# Patient Record
Sex: Female | Born: 1985 | Race: Black or African American | Hispanic: No | Marital: Married | Smoking: Never smoker
Health system: Southern US, Community
[De-identification: ages and names within clinical notes are randomized; demographics above are authoritative.]

## PROBLEM LIST (undated history)

## (undated) DIAGNOSIS — J45909 Unspecified asthma, uncomplicated: Secondary | ICD-10-CM

## (undated) HISTORY — PX: NO PAST SURGERIES: SHX2092

---
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
## 2005-03-19 ENCOUNTER — Other Ambulatory Visit: Admission: RE | Admit: 2005-03-19 | Discharge: 2005-03-19 | Payer: Self-pay | Admitting: Obstetrics & Gynecology

## 2005-03-20 ENCOUNTER — Other Ambulatory Visit: Admission: RE | Admit: 2005-03-20 | Discharge: 2005-03-20 | Payer: Self-pay | Admitting: Obstetrics & Gynecology

## 2007-06-06 ENCOUNTER — Other Ambulatory Visit: Admission: RE | Admit: 2007-06-06 | Discharge: 2007-06-06 | Payer: Self-pay | Admitting: Gynecology

## 2008-04-09 ENCOUNTER — Emergency Department (HOSPITAL_COMMUNITY): Admission: EM | Admit: 2008-04-09 | Discharge: 2008-04-09 | Payer: Self-pay | Admitting: Emergency Medicine

## 2010-10-29 DIAGNOSIS — J45909 Unspecified asthma, uncomplicated: Secondary | ICD-10-CM | POA: Insufficient documentation

## 2011-07-26 LAB — POCT I-STAT, CHEM 8
BUN: 7
Calcium, Ion: 1.26
Chloride: 106
Creatinine, Ser: 0.8
Glucose, Bld: 92
HCT: 34 — ABNORMAL LOW
Hemoglobin: 11.6 — ABNORMAL LOW
Potassium: 4.4
Sodium: 140
TCO2: 28

## 2011-07-26 LAB — POCT PREGNANCY, URINE
Operator id: 161631
Preg Test, Ur: NEGATIVE

## 2011-07-26 LAB — URINALYSIS, ROUTINE W REFLEX MICROSCOPIC
Bilirubin Urine: NEGATIVE
Glucose, UA: NEGATIVE
Hgb urine dipstick: NEGATIVE
Ketones, ur: NEGATIVE
Nitrite: NEGATIVE
Protein, ur: NEGATIVE
Specific Gravity, Urine: 1.006
Urobilinogen, UA: 0.2
pH: 7.5

## 2011-07-26 LAB — POCT CARDIAC MARKERS
CKMB, poc: 2.5
Operator id: 161631
Troponin i, poc: <0.05

## 2013-07-28 ENCOUNTER — Emergency Department (HOSPITAL_COMMUNITY)
Admission: EM | Admit: 2013-07-28 | Discharge: 2013-07-28 | Disposition: A | Discharge status: Home / Self Care | Payer: BC Managed Care – PPO | Attending: Emergency Medicine | Admitting: Emergency Medicine

## 2013-07-28 ENCOUNTER — Encounter (HOSPITAL_COMMUNITY): Payer: Self-pay | Admitting: Emergency Medicine

## 2013-07-28 ENCOUNTER — Emergency Department (HOSPITAL_COMMUNITY): Payer: BC Managed Care – PPO

## 2013-07-28 DIAGNOSIS — R0602 Shortness of breath: Secondary | ICD-10-CM

## 2013-07-28 DIAGNOSIS — R42 Dizziness and giddiness: Secondary | ICD-10-CM | POA: Insufficient documentation

## 2013-07-28 DIAGNOSIS — IMO0002 Reserved for concepts with insufficient information to code with codable children: Secondary | ICD-10-CM | POA: Insufficient documentation

## 2013-07-28 DIAGNOSIS — J45909 Unspecified asthma, uncomplicated: Secondary | ICD-10-CM

## 2013-07-28 DIAGNOSIS — Z3202 Encounter for pregnancy test, result negative: Secondary | ICD-10-CM | POA: Insufficient documentation

## 2013-07-28 DIAGNOSIS — R51 Headache: Secondary | ICD-10-CM | POA: Insufficient documentation

## 2013-07-28 DIAGNOSIS — J45901 Unspecified asthma with (acute) exacerbation: Secondary | ICD-10-CM | POA: Insufficient documentation

## 2013-07-28 HISTORY — DX: Unspecified asthma, uncomplicated: J45.909

## 2013-07-28 MED ORDER — IPRATROPIUM BROMIDE 0.02 % IN SOLN
0.5000 mg | Freq: Once | RESPIRATORY_TRACT | Status: AC
  Administered 2013-07-28: 0.5 mg via RESPIRATORY_TRACT

## 2013-07-28 MED ORDER — PREDNISONE 20 MG PO TABS: 40.0000 mg | ORAL_TABLET | Freq: Every day | ORAL | Status: DC

## 2013-07-28 MED ORDER — ALBUTEROL SULFATE (5 MG/ML) 0.5% IN NEBU
INHALATION_SOLUTION | RESPIRATORY_TRACT | Status: AC
  Administered 2013-07-28: 5 mg via RESPIRATORY_TRACT
  Filled 2013-07-28: qty 1

## 2013-07-28 MED ORDER — ALBUTEROL SULFATE (5 MG/ML) 0.5% IN NEBU
5.0000 mg | INHALATION_SOLUTION | Freq: Once | RESPIRATORY_TRACT | Status: AC
  Administered 2013-07-28: 5 mg via RESPIRATORY_TRACT

## 2013-07-28 MED ORDER — IPRATROPIUM BROMIDE 0.02 % IN SOLN
RESPIRATORY_TRACT | Status: AC
  Administered 2013-07-28: 0.5 mg via RESPIRATORY_TRACT
  Filled 2013-07-28: qty 2.5

## 2013-07-28 NOTE — ED Notes (Signed)
RT called for neb tx.

## 2013-07-28 NOTE — ED Notes (Signed)
Pt reports recurrent shortness of breath, gasping for air, coughing, lightheadedness x 4 days

## 2013-07-28 NOTE — Discharge Instructions (Signed)
Asthma, Adult Asthma is a disease of the lungs and can make it hard to breathe. Asthma cannot be cured, but medicine can help control it. Asthma may be started (triggered) by:  Pollen.  Dust.  Animal skin flakes (dander).  Molds.  Foods.  Respiratory infections (colds, flu).  Smoke.  Exercise.  Stress.  Other things that cause allergic reactions or allergies (allergens). HOME CARE   Talk to your doctor about how to manage your attacks at home. This may include:  Using a tool called a peak flow meter.  Having medicine ready to stop the attack.  Take all medicine as told by your doctor.  Wash bed sheets and blankets every week in hot water and put them in the dryer.  Drink enough fluids to keep your pee (urine) clear or pale yellow.  Always be ready to get emergency help. Write down the phone number for your doctor. Keep it where you can easily find it.  Talk about exercise routines with your doctor.  If animal dander is causing your asthma, you may need to find a new home for your pet(s). GET HELP RIGHT AWAY IF:   You have muscle aches.  You cough more.  You have chest pain.  You have thick spit (sputum) that changes to yellow, green, gray, or bloody.  Medicine does not stop your wheezing.  You have problems breathing.  You have a fever.  Your medicine causes:  A rash.  Itching.  Puffiness (swelling).  Breathing problems. MAKE SURE YOU:   Understand these instructions.  Will watch your condition.  Will get help right away if you are not doing well or get worse. Document Released: 04/02/2008 Document Revised: 01/07/2012 Document Reviewed: 08/25/2008 [facility] Patient Information 2014 [place], [place].  Asthma, Acute Bronchospasm Your exam shows you have asthma, or acute bronchospasm that acts like asthma. Bronchospasm means your air passages become narrowed. These conditions are due to inflammation and airway spasm that cause narrowing of the  bronchial tubes in the lungs. This causes you to have wheezing and shortness of breath. POSSIBLE TRIGGERS  Animal dander from the skin, hair, or feathers of animals.  Dust mites contained in house dust.  Cockroaches.  Pollen from trees or grass.  Mold.  Cigarette or tobacco smoke.  Air pollutants such as dust, household cleaners, hair sprays, aerosol sprays, paint fumes, strong chemicals, or strong odors.  Cold air or weather changes. Cold air may cause inflammation. Winds increase molds and pollens in the air.  Strong emotions such as crying or laughing hard.  Stress.  Certain medicines such as aspirin or beta-blockers.  Sulfites in such foods and drinks as dried fruits and wine.  Infections or inflammatory conditions such as a flu, cold, or inflammation of the nasal membranes (rhinitis).  Gastroesophageal reflux disease (GERD). GERD is a condition where stomach acid backs up into your throat (esophagus).  Exercise or strenous activity. TREATMENT  Treatment is aimed at making the narrowed airways larger. Mild asthma or bronchospasm is usually controlled with inhaled medicines. Albuterol is a common medicine that you breathe in to open spastic or narrowed airways. Steroid medicine is also used to reduce the inflammation when an attack is moderate or severe. Antibiotics are only used if a bacterial infection is present.  HOME CARE INSTRUCTIONS   Rest.  Drink plenty of liquids. This helps the mucus to remain thin and easily coughed up. Do not use caffeine or alcohol.  Do not smoke. Avoid being exposed to secondhand smoke.  You play a critical role in keeping yourself in good health. Avoid exposure to things that cause you to wheeze. Avoid exposure to things that cause you to have breathing problems. Keep your medicines up-to-date and available. Carefully follow your caregiver's treatment plan.  When pollen or pollution is bad, keep windows closed and use an air conditioner  or go to places with air conditioning.  Take your medicine exactly as prescribed.  Asthma requires careful medical attention. See your caregiver for follow-up as advised. If you are more than [redacted] weeks pregnant and you were prescribed any new medicines, let your obstetrician know about the visit and how you are doing. Arrange a recheck. SEEK IMMEDIATE MEDICAL CARE IF:   You are getting worse.  You have trouble breathing. If severe, call your local emergency services 911 in [place]  You develop chest pain or discomfort.  You are throwing up or not drinking fluids.  You are coughing up yellow, green, brown, or bloody sputum.  You have a fever or persistent symptoms for more than 2 3 days.  You have a fever and your symptoms suddenly get worse.  You have trouble swallowing. MAKE SURE YOU:   Understand these instructions.  Will watch your condition.  Will get help right away if you are not doing well or get worse. Document Released: 01/30/2007 Document Revised: 10/01/2012 Document Reviewed: 09/29/2007 [facility] Patient Information 2014 [place], [place]. RESOURCE GUIDE  Chronic Pain Problems: Contact [facility]  [phone] Patients need to be referred by their primary care doctor.  Insufficient Money for Medicine: Contact United Way:  call [phone]  No Primary Care Doctor: - Call Health Connect  [phone] - can help you locate a primary care doctor that  accepts your insurance, provides certain services, etc. - Physician Referral Service- [phone]  Agencies that provide inexpensive medical care: - [facility]  [phone] - [facility]  [phone] - [facility]  [phone] - Women's Clinic  [phone] - Planned Parenthood  [phone] [facility]  [phone]  Medicaid-accepting [facility] Providers: - [facility]- [address]  [phone], Mon-Fri 9am-7pm, Sat  9am-1pm - [facility] [place]- [address] [place], [address] [place]  [phone] - [facility]- [address] [place]  [phone] [facility]- [address]  [phone] - Renaye Rakers- [address] [place], [address], [phone]  Only accepts [place] patients after they have their name  applied to their card  Self Pay (no insurance) in [place]: - Sickle Cell Patients - [facility]  [address] [place], [phone] - [facility]- [address] [place]  [phone]       [facility] [place]- [address]       -     [facility]- see information above (Speak to [employer] if you do not have insurance)       -  [facility]- [address],  [phone]       -  [facility]- [address], [phone]       -  Dr Julio Sicks-  [address], [place], [phone]       -  [facility] - [address] [phone]       -  [facility]- [address], [phone], also [address], [phone]       -     [facility]- [address] [place], [phone], 1st & 3rd Saturday         every month, 10am-1pm  -     [facility]   [address] [place], [place].   Phone:  [phone], Fax:  [phone]. Hours of Operation:  9 am - 6 pm, M-F.  -     [facility] for Children   [address], [place]   Phone: [phone], Fax: [phone]. Hours of Operation:  8:30 am - 5:30 pm, M-F.  [facility] [address] [place], [place] [address] [phone]  [facility] [address] [place], [place] [address] [phone]  1) Find a Doctor and Pay Out of Pocket Although you won't have to find out who is covered by your insurance plan, it is a good idea to ask around and get recommendations. You will then  need to call the office and see if the doctor you have chosen will accept you as a new patient and what types of options they offer for patients who are self-pay. Some doctors offer discounts or will set up payment plans for their patients who do not have insurance, but you will need to ask so you aren't surprised when you get to your appointment.  2) Contact Your Local Health Department Not all health departments have doctors that can see patients for sick visits, but many do, so it is worth a call to see if yours does. If you don't know where your local health department is, you can check in your phone book. The CDC also has a tool to help you locate your state's health department, and many state websites also have listings of all of their local health departments.  3) Find a Walk-in Clinic If your illness is not likely to be very severe or complicated, you may want to try a walk in clinic. These are popping up all over the country in pharmacies, drugstores, and shopping centers. They're usually staffed by nurse practitioners or physician assistants that have been trained to treat common illnesses and complaints. They're usually fairly quick and inexpensive. However, if you have serious medical issues or chronic medical problems, these are probably not your best option  STD Testing - [facility] Department of [facility] [place], STD Clinic, [address], [place], phone [phone] or [phone].  Monday - Friday, call for an appointment. [facility] Department of [employer], STD Clinic, [place] [address], [place], phone [phone] or [phone].  Monday - Friday, call for an appointment.  Abuse/Neglect: [facility] Child Abuse Hotline [phone] [facility] Child Abuse Hotline [phone] (After Hours)  Emergency Shelter:  [facility] [phone]  Maternity Homes: - Room at the [place] [facility] [phone] - [facility] ([phone]  MRSA Hotline #:   570-612-8027  Dental Assistance If unable to pay or uninsured, contact:  [facility]. to become qualified for the adult dental clinic.  Patients with Medicaid: [facility] [phone], [phone] [address] [phone]  If unable to pay, or uninsured, contact [facility] [phone] in [place], [phone] in [facility]) to become qualified for the adult dental clinic  [facility] [address] [place], [place] [address] [phone] [email or web address]  Other Proofreader Services: - [facility]- [address] [place], [place], [place], [address], [phone], Ext. [phone], 2nd and 4th Thursday of the month at 6:30am.  10 clients each day by appointment, can sometimes see walk-in patients if someone does not show for an appointment. [facility]- [address] [place], [place], [address], [phone] - [facility] [address] [place], [place], [address], [phone] - [facility]- [phone] [facility]- [phone] [facility] Health [phone]       Behavioral Health Resources in the [facility]  Intensive Outpatient Programs: [facility]      [address] [place], [place] [phone] Both a day and evening program       [facility] Outpatient     [address]        [place], [place] [address] [phone]         ADS: [facility] [address] [place] [phone]  [facility] Mental Health ACCESS LINE: [phone] or [phone] [address] [place], [place] [address] [email or web address]   Substance Abuse Resources: - [facility] Drug [facility]  4421899066 - [facility] [phone] - [facility]  [place] [phone] Floydene Flock [phone] - Residential & Outpatient Substance Abuse Program  [phone]  Psychological Services: [facility]  [phone] [facility]  [phone] - [facility], [phone] [place]. [address] [place], ACCESS LINE: (307)[phone] or 302-064-1934, [email or web address]  Mobile Crisis Teams:                                        Therapeutic Alternatives         Mobile Crisis Care Unit [phone]             [facility] [address] Ginette Otto [phone]                                         Interventionist [address] [place] [phone]  Self-Help/Support Groups: Mental Health Assoc. of [employer] of support groups (734)415-5634 (call for more info)  Narcotics Anonymous (NA) Caring Services [address] [place] - 2 meetings at this location  Residential Treatment Programs:  [facility]      [address]        [place]       [phone]         [facility] [address], [place] [address] [place], [place]  [address] [phone]  [facility]  [address] [place], [place] [address] [phone] Admissions: 8am-3pm M-F  [facility]     [address]        [place], [place] [address]       [phone]         [facility] [address] [place], [place] [phone]  [facility] [address] [place], [place] [phone]  [facility] - Intensive Outpatient      [address]     [place], [place]  [phone]         [facility] (Addiction Recovery Care [facility].)     [address] [place], [place] [phone] or [phone]  Residential Treatment Services (RTS), Medicaid [address] [place], [place] [phone]  Fellowship [address]                                                [address] [place] [phone]  [facility] Outpatient [facility] Resources: [phone]               General Therapy                                                Angie Fava, PhD        [address] [place], [place] [address]         [phone]   Insurance  [facility]al   [address] [place], [place] [address] [phone]  [facility] Recovery [address] [place], [place] [address] [phone] Insurance/Medicaid/sponsorship through [facility] and Families                                              [address]                                        [place], [place] [address]    Therapy/tele-psych/case         [phone]          [facility] [address], [place]  [address]  Adolescent/group home/case management [phone]                                           Creola Corn PhD       General therapy       Insurance   [phone]         Dr. Lolly Mustache, [place], M-F [phone]  [facility] [place]  [employer] [facility] Dept. [address]                 [address]         [address] [place] [address]  [address]  [facility] Phone:  [phone]                                  Phone:  [phone]                   Phone:  [phone]  [facility], [phone] - [facility] - [facility]- [phone]       -     [facility] in [place], [address],             [phone], [facility] Child Abuse Hotline 515-717-8811 or [phone] (After Hours)

## 2013-07-28 NOTE — ED Provider Notes (Addendum)
CSN: [id number]     Arrival date & time 07/28/13  1754 History  This chart was scribed for non-physician practitioner, Junious Silk, PA-C working with Shon Baton, MD by Greggory Stallion, ED scribe. This patient was seen in room WTR8/WTR8 and the patient's care was started at 7:18 PM.   Chief Complaint  Patient presents with  . Shortness of Breath    intermittent coughing and shortness of breath   The history is provided by the patient. No language interpreter was used.    HPI Comments: Misty Logan is a 27 y.o. female who presents to the Emergency Department complaining of intermittent SOB with associated unproductive cough and light headedness that started 4 days ago. She states it feels like it is starting to get worse because the frequency is starting to increase. She states she gets associated headaches and has some rhinorrhea. The rhinorrhea has been going on for quite some time. Pt states she has used her inhaler with no relief. She was diagnosed with asthma about one year ago. Pt states when she breaths in, there is more of a tightness instead of a pain. She denies leg swelling, long trips, recent surgeries, exogenous estrogen, hemoptysis, abdominal pain, nausea, emesis, ear pain, sore throat and congestion. 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 She denies h/o blood clots.  Past Medical History  Diagnosis Date  . Asthma    History reviewed. No pertinent past surgical history. Family History  Problem Relation Age of Onset  . Hypertension Mother   . Hypertension Other   . Diabetes Other    History  Substance Use Topics  . Smoking status: Never Smoker   . Smokeless tobacco: Not on file  . Alcohol Use: Yes   OB History   Grav Para Term Preterm Abortions TAB SAB Ect Mult Living                 Review of Systems  HENT: Positive for rhinorrhea. Negative for ear pain, congestion and sore throat.   Respiratory: Positive for cough and shortness of  breath.   Cardiovascular: Negative for leg swelling.  Gastrointestinal: Negative for nausea, vomiting and abdominal pain.  Neurological: Positive for light-headedness and headaches.  All other systems reviewed and are negative.    Allergies  Review of patient's allergies indicates no known allergies.  Home Medications   Current Outpatient Rx  Name  Route  Sig  Dispense  Refill  . predniSONE (DELTASONE) 20 MG tablet   Oral   Take 2 tablets (40 mg total) by mouth daily. Take 40 mg by mouth daily for 3 days, then 20mg  by mouth daily for 3 days, then 10mg  daily for 3 days   12 tablet   0     BP 108/62  Pulse 87  Temp(Src) 98.7 F (37.1 C) (Oral)  Resp 18  Wt 140 lb (63.504 kg)  SpO2 100%  LMP 06/04/2013  Physical Exam  Nursing note and vitals reviewed. Constitutional: She is oriented to person, place, and time. Vital signs are normal. She appears well-developed and well-nourished.  Non-toxic appearance. She does not have a sickly appearance. She does not appear ill. No distress.  HENT:  Head: Normocephalic and atraumatic.  Right Ear: Tympanic membrane, external ear and ear canal normal.  Left Ear: Tympanic membrane, external ear and ear canal normal.  Nose: Nose normal.  Mouth/Throat: Oropharynx is clear and moist.  Eyes: Conjunctivae are normal.  Neck: Normal range  of motion.  Cardiovascular: Normal rate, regular rhythm, normal heart sounds, intact distal pulses and normal pulses.   Homan's sign negative bilaterally. No swelling of legs bilaterally.   Pulmonary/Chest: Effort normal and breath sounds normal. No stridor. No respiratory distress. She has no decreased breath sounds. She has no wheezes. She has no rhonchi. She has no rales.  Abdominal: Soft. She exhibits no distension.  Musculoskeletal: Normal range of motion.  Lymphadenopathy:    She has no cervical adenopathy.  Neurological: She is alert and oriented to person, place, and time. She has normal strength.   Skin: Skin is warm and dry. She is not diaphoretic. No erythema.  Psychiatric: She has a normal mood and affect. Her behavior is normal.    ED Course  Procedures (including critical care time)  DIAGNOSTIC STUDIES: Oxygen Saturation is 100% on RA, normal by my interpretation.    COORDINATION OF CARE: 7:24 PM-Discussed treatment plan which includes chest xray and breathing treatment with pt at bedside and pt agreed to plan.   Labs Review Labs Reviewed  POCT PREGNANCY, URINE   Imaging Review Dg Chest 2 View  07/28/2013   CLINICAL DATA:  Shortness of breath. History of asthma.  EXAM: CHEST  2 VIEW  COMPARISON:  04/09/2008.  FINDINGS: The cardiac silhouette, mediastinal and hilar contours are normal and stable. The lungs demonstrate mild hyperinflation but no infiltrates, effusions or pneumothorax. The bony thorax is intact.  IMPRESSION: Mild hyperinflation but no acute pulmonary findings.   Electronically Signed   By: Loralie Champagne M.D.   On: 07/28/2013 20:05    MDM   1. Shortness of breath   2. Asthma    Patient presents today with shortness of breath. She is PERC negative. Patient is not a smoker. Patient is otherwise healthy. She has never been hospitalized or intubated for her asthma. Lungs are clear to auscultation. Patient's oxygen saturation is 100% on room air. Chest x-ray shows mild hyperinflation, but no acute pulmonary findings including pneumothorax, effusion, infiltrate. I will discharge her home with a 5 day prednisone burst. Strict return instructions were given. Strongly encouraged to establish care with a primary care provider. Vital signs stable for discharge. Discussed case with Dr. Wilkie Aye who agrees with plan.     I personally performed the services described in this documentation, which was scribed in my presence. The recorded information has been reviewed and is accurate.    Mora Bellman, PA-C 07/28/13 2053  Mora Bellman, PA-C 07/31/13 1525

## 2013-07-29 NOTE — ED Provider Notes (Signed)
Medical screening examination/treatment/procedure(s) were performed by non-physician practitioner and as supervising physician I was immediately available for consultation/collaboration.  Shon Baton, MD 07/29/13 [phone]

## 2013-07-31 NOTE — ED Provider Notes (Signed)
Medical screening examination/treatment/procedure(s) were performed by non-physician practitioner and as supervising physician I was immediately available for consultation/collaboration.   Shon Baton, MD 07/31/13 [phone]

## 2013-09-15 ENCOUNTER — Other Ambulatory Visit: Payer: Self-pay | Admitting: Internal Medicine

## 2013-09-15 DIAGNOSIS — R0602 Shortness of breath: Secondary | ICD-10-CM

## 2013-09-29 ENCOUNTER — Encounter (HOSPITAL_COMMUNITY): Payer: BC Managed Care – PPO

## 2013-11-22 ENCOUNTER — Encounter (HOSPITAL_COMMUNITY): Payer: Self-pay | Admitting: Emergency Medicine

## 2013-11-22 ENCOUNTER — Emergency Department (HOSPITAL_COMMUNITY): Payer: BC Managed Care – PPO

## 2013-11-22 ENCOUNTER — Emergency Department (HOSPITAL_COMMUNITY)
Admission: EM | Admit: 2013-11-22 | Discharge: 2013-11-23 | Disposition: A | Discharge status: Home / Self Care | Payer: BC Managed Care – PPO | Attending: Emergency Medicine | Admitting: Emergency Medicine

## 2013-11-22 DIAGNOSIS — R071 Chest pain on breathing: Secondary | ICD-10-CM | POA: Insufficient documentation

## 2013-11-22 DIAGNOSIS — R0602 Shortness of breath: Secondary | ICD-10-CM

## 2013-11-22 DIAGNOSIS — J45909 Unspecified asthma, uncomplicated: Secondary | ICD-10-CM | POA: Insufficient documentation

## 2013-11-22 DIAGNOSIS — R079 Chest pain, unspecified: Secondary | ICD-10-CM

## 2013-11-22 NOTE — ED Provider Notes (Signed)
CSN: [id number]     Arrival date & time 11/22/13  2001 History   First MD Initiated Contact with Patient 11/22/13 2127     Chief Complaint  Patient presents with  . Asthma   (Consider location/radiation/quality/duration/timing/severity/associated sxs/prior Treatment) HPI Pt is a 28yo female with newly dx asthma c/o chest pain and tightness brought on by asthma exacerbation that started yesterday associated with intermittent dry cough. Pt states she has been trying her nebulizer machine at home w/o relief. States when she uses it she feels "shaky."  Chest pain is squeezing in nature waxes and wanes with cough.  States symptoms have progressively worsened since Friday, 1/23, however states she has been having these intermittent symptoms since Oct. 2014 for which she uses albuterol every 4 hours as needed for her SOB. Denies fever, n/v/d. Denies nasal congestion, sick contacts, or recent travel. Denies cardiac hx. Denies hx of blood clots, leg pain or swelling, denies recent surgeries. Denies taking birth control or other exogenous estrogen.   Past Medical History  Diagnosis Date  . Asthma    History reviewed. No pertinent past surgical history. Family History  Problem Relation Age of Onset  . Hypertension Mother   . Hypertension Other   . Diabetes Other    History  Substance Use Topics  . Smoking status: Never Smoker   . Smokeless tobacco: Not on file  . Alcohol Use: Yes   OB History   Grav Para Term Preterm Abortions TAB SAB Ect Mult Living                 Review of Systems  Respiratory: Positive for cough, chest tightness and shortness of breath.   Cardiovascular: Positive for chest pain.  Gastrointestinal: Negative for nausea, vomiting, abdominal pain and diarrhea.  Psychiatric/Behavioral: The patient is nervous/anxious ( does report "jitteriness" after albuterol nebulizer tx).   All other systems reviewed and are negative.    Allergies  Review of patient's allergies  indicates no known allergies.  Home Medications  No current outpatient prescriptions on file. BP 112/63  Pulse 89  Temp(Src) 98.3 F (36.8 C) (Oral)  Resp 13  Ht 5\' 6"  (1.676 m)  Wt 146 lb (66.225 kg)  BMI 23.58 kg/m2  SpO2 98%  LMP 11/15/2013 Physical Exam  Nursing note and vitals reviewed. Constitutional: She appears well-developed and well-nourished. No distress.  Pt lying comfortably in exam bed, NAD.  Pt texting throughout exam.  HENT:  Head: Normocephalic and atraumatic.  Eyes: Conjunctivae are normal. No scleral icterus.  Neck: Normal range of motion.  Cardiovascular: Normal rate, regular rhythm and normal heart sounds.   Pulmonary/Chest: Effort normal and breath sounds normal. No respiratory distress. She has no wheezes. She has no rales. She exhibits no tenderness.  No respiratory distress, able to speak in full sentences w/o difficulty. Lungs: CTAB  Abdominal: Soft. Bowel sounds are normal. She exhibits no distension and no mass. There is no tenderness. There is no rebound and no guarding.  Musculoskeletal: Normal range of motion.  Neurological: She is alert.  Skin: Skin is warm and dry. She is not diaphoretic.    ED Course  Procedures (including critical care time) Labs Review Labs Reviewed  D-DIMER, QUANTITATIVE   Imaging Review Dg Chest 2 View  11/22/2013   CLINICAL DATA:  Chest pain, shortness of breath  EXAM: CHEST  2 VIEW  COMPARISON:  07/28/2013  FINDINGS: The heart size and mediastinal contours are within normal limits. Both lungs are clear. The visualized  skeletal structures are unremarkable.  IMPRESSION: No active cardiopulmonary disease.   Electronically Signed   By: Ruel Favorsrevor  Shick M.D.   On: 11/22/2013 23:22    EKG Interpretation   None       MDM   1. SOB (shortness of breath)   2. Chest pain    Pt with hx of newly dx asthma presenting with CP and SOB. Not concerned for ACS due to pt's age and lack of risk factors. PERC negative, low suspicion  for PE.  On exam, pt appears well, non-toxic. No respiratory distress. Lungs: CTAB.  Vitals: unremarkable.   CXR: no active cardiopulmonary disease  D-dimer: negative.   All labs/imaging/findings discussed with patient. All questions answered and concerns addressed. Will discharge pt home and have pt f/u with her PCP for referral to pulmonology and/or allergist to help pt determine trigger for her reported SOB. Return precautions given. Pt verbalized understanding and agreement with tx plan. Vitals: unremarkable. Discharged in stable condition.    Discussed pt with attending during ED encounter and agrees with plan.    Junius FinnerErin O'Malley, PA-C 11/23/13 [phone]

## 2013-11-22 NOTE — ED Notes (Signed)
Pt arrived to the ED with a complaint of chest pain brought on by asthma excerebration.  Pt is a newly dx asthma patient.  Pt has used both her nebulizer and her rescue inhaler without relief.  Pt has a cough that started yesterday

## 2013-11-23 LAB — D-DIMER, QUANTITATIVE (NOT AT ARMC): D-Dimer, Quant: <0.27 ug/mL-FEU (ref 0.00–0.48)

## 2013-11-23 NOTE — Discharge Instructions (Signed)
Chest Pain (Nonspecific) °Chest pain has many causes. Your pain could be caused by something serious, such as a heart attack or a blood clot in the lungs. It could also be caused by something less serious, such as a chest bruise or a virus. Follow up with your doctor. More lab tests or other studies may be needed to find the cause of your pain. Most of the time, nonspecific chest pain will improve within 2 to 3 days of rest and mild pain medicine. °HOME CARE °· For chest bruises, you may put ice on the sore area for 15-20 minutes, 03-04 times a day. Do this only if it makes you feel better. °· Put ice in a plastic bag. °· Place a towel between the skin and the bag. °· Rest for the next 2 to 3 days. °· Go back to work if the pain improves. °· See your doctor if the pain lasts longer than 1 to 2 weeks. °· Only take medicine as told by your doctor. °· Quit smoking if you smoke. °GET HELP RIGHT AWAY IF:  °· There is more pain or pain that spreads to the arm, neck, jaw, back, or belly (abdomen). °· You have shortness of breath. °· You cough more than usual or cough up blood. °· You have very bad back or belly pain, feel sick to your stomach (nauseous), or throw up (vomit). °· You have very bad weakness. °· You pass out (faint). °· You have a fever. °Any of these problems may be serious and may be an emergency. Do not wait to see if the problems will go away. Get medical help right away. Call your local emergency services 911 in [place] Do not drive yourself to the hospital. °MAKE SURE YOU:  °· Understand these instructions. °· Will watch this condition. °· Will get help right away if you or your child is not doing well or gets worse. °Document Released: 04/02/2008 Document Revised: 01/07/2012 Document Reviewed: 04/02/2008 °ExitCare® Patient Information ©2014 [employer]. ° °

## 2013-11-24 ENCOUNTER — Ambulatory Visit (HOSPITAL_COMMUNITY)
Admission: RE | Admit: 2013-11-24 | Discharge: 2013-11-24 | Disposition: A | Discharge status: Home / Self Care | Payer: BC Managed Care – PPO | Source: Ambulatory Visit | Attending: Internal Medicine | Admitting: Internal Medicine

## 2013-11-24 DIAGNOSIS — J988 Other specified respiratory disorders: Secondary | ICD-10-CM | POA: Insufficient documentation

## 2013-11-24 DIAGNOSIS — R0602 Shortness of breath: Secondary | ICD-10-CM | POA: Insufficient documentation

## 2013-11-24 LAB — PULMONARY FUNCTION TEST
DL/VA % pred: 97 %
DL/VA: 4.81 ml/min/mmHg/L
DLCO UNC: 26.32 ml/min/mmHg
DLCO cor % pred: 102 %
DLCO cor: 26.32 ml/min/mmHg
DLCO unc % pred: 102 %
FEF 25-75 Post: 2.09 L/sec
FEF 25-75 Pre: 2.85 L/sec
FEF2575-%Change-Post: -26 %
FEF2575-%PRED-PRE: 84 %
FEF2575-%Pred-Post: 61 %
FEV1-%CHANGE-POST: -13 %
FEV1-%PRED-POST: 90 %
FEV1-%PRED-PRE: 104 %
FEV1-Post: 2.6 L
FEV1-Pre: 3.01 L
FEV1FVC-%Change-Post: -19 %
FEV1FVC-%Pred-Pre: 96 %
FEV6-%Change-Post: 6 %
FEV6-%PRED-POST: 116 %
FEV6-%Pred-Pre: 109 %
FEV6-PRE: 3.62 L
FEV6-Post: 3.87 L
FEV6FVC-%PRED-PRE: 101 %
FEV6FVC-%Pred-Post: 101 %
FVC-%Change-Post: 7 %
FVC-%PRED-PRE: 108 %
FVC-%Pred-Post: 115 %
FVC-POST: 3.88 L
FVC-Pre: 3.62 L
POST FEV1/FVC RATIO: 67 %
POST FEV6/FVC RATIO: 100 %
PRE FEV6/FVC RATIO: 100 %
Pre FEV1/FVC ratio: 83 %
RV % PRED: 166 %
RV: 2.3 L
TLC % PRED: 115 %
TLC: 6.01 L

## 2013-11-24 MED ORDER — ALBUTEROL SULFATE (2.5 MG/3ML) 0.083% IN NEBU
2.5000 mg | INHALATION_SOLUTION | Freq: Once | RESPIRATORY_TRACT | Status: AC
  Administered 2013-11-24: 2.5 mg via RESPIRATORY_TRACT

## 2013-11-24 NOTE — ED Provider Notes (Signed)
Medical screening examination/treatment/procedure(s) were performed by non-physician practitioner and as supervising physician I was immediately available for consultation/collaboration.  EKG Interpretation    Date/Time:  Sunday November 22 2013 20:41:54 EST Ventricular Rate:  82 PR Interval:  126 QRS Duration: 77 QT Interval:  405 QTC Calculation: 473 R Axis:   78 Text Interpretation:  Sinus rhythm ED PHYSICIAN INTERPRETATION AVAILABLE IN [facility] Confirmed by TEST, RECORD ([id number]) on 11/24/2013 9:30:10 AM              Junius ArgyleForrest S Damaris Abeln, MD 11/24/13 1048

## 2013-11-25 ENCOUNTER — Ambulatory Visit (HOSPITAL_COMMUNITY): Admission: RE | Admit: 2013-11-25 | Payer: BC Managed Care – PPO | Source: Ambulatory Visit

## 2017-08-12 ENCOUNTER — Ambulatory Visit (INDEPENDENT_AMBULATORY_CARE_PROVIDER_SITE_OTHER): Payer: BC Managed Care – PPO | Admitting: Podiatry

## 2017-08-12 ENCOUNTER — Ambulatory Visit (INDEPENDENT_AMBULATORY_CARE_PROVIDER_SITE_OTHER): Payer: BC Managed Care – PPO

## 2017-08-12 VITALS — BP 112/78 | HR 78 | Ht 65.0 in | Wt 153.0 lb

## 2017-08-12 DIAGNOSIS — S99922A Unspecified injury of left foot, initial encounter: Secondary | ICD-10-CM

## 2017-08-12 MED ORDER — DICLOFENAC SODIUM 75 MG PO TBEC
75.0000 mg | DELAYED_RELEASE_TABLET | Freq: Two times a day (BID) | ORAL | 0 refills | Status: DC
Start: 2017-08-12 — End: 2019-10-01

## 2017-08-12 NOTE — Progress Notes (Signed)
   Subjective:    Patient ID: Misty Logan, female    DOB: 03-Jun-1986, 31 y.o.   MRN: [id number]  HPI    Review of Systems  Musculoskeletal: Positive for myalgias.  Neurological: Positive for headaches.  All other systems reviewed and are negative.      Objective:   Physical Exam        Assessment & Plan:

## 2017-08-14 NOTE — Progress Notes (Signed)
   HPI: 31 year old female presenting today for new patient is a complaint of an injury to the left foot that occurred 3-4 weeks ago. She reports pain to the lateral side of the foot. 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 She rates her pain at 1/10 currently but states it is 8/10 at its worst. There are no modifying factors noted. She is here for further evaluation and treatment.   Past Medical History:  Diagnosis Date  . Asthma      Physical Exam: General: The patient is alert and oriented x3 in no acute distress.  Dermatology: Skin is warm, dry and supple bilateral lower extremities. Negative for open lesions or macerations.  Vascular: Palpable pedal pulses bilaterally. No edema or erythema noted. Capillary refill within normal limits.  Neurological: Epicritic and protective threshold grossly intact bilaterally.   Musculoskeletal Exam: Pain with palpation over fifth metatarsal of the left foot. Range of motion within normal limits to all pedal and ankle joints bilateral. Muscle strength 5/5 in all groups bilateral.   Radiographic Exam:  Normal osseous mineralization. Joint spaces preserved. No fracture/dislocation/boney destruction.    Assessment: - Left foot sprain   Plan of Care:  - Patient evaluated. X-rays reviewed. - Cam boot dispensed. Weightbearing as tolerated. - Prescription for diclofenac 75 mg #60 given to patient. - Return to clinic in 4 weeks.  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    Felecia ShellingBrent M. Evans, DPM [facility]  Dr. Felecia ShellingBrent M. Evans, DPM    [address]                                        [place], [place] [address]                Office [phone]  Fax [phone]

## 2017-09-09 ENCOUNTER — Encounter: Payer: BC Managed Care – PPO | Admitting: Podiatry

## 2017-09-14 NOTE — Progress Notes (Signed)
This encounter was created in error - please disregard.

## 2018-12-02 ENCOUNTER — Other Ambulatory Visit: Payer: Self-pay | Admitting: Family Medicine

## 2018-12-02 DIAGNOSIS — N644 Mastodynia: Secondary | ICD-10-CM

## 2018-12-08 ENCOUNTER — Other Ambulatory Visit: Payer: Self-pay | Admitting: Family Medicine

## 2018-12-08 ENCOUNTER — Ambulatory Visit
Admission: RE | Admit: 2018-12-08 | Discharge: 2018-12-08 | Disposition: A | Discharge status: Home / Self Care | Payer: BC Managed Care – PPO | Source: Ambulatory Visit | Attending: Family Medicine | Admitting: Family Medicine

## 2018-12-08 ENCOUNTER — Ambulatory Visit: Payer: BC Managed Care – PPO

## 2018-12-08 DIAGNOSIS — N6320 Unspecified lump in the left breast, unspecified quadrant: Secondary | ICD-10-CM

## 2018-12-08 DIAGNOSIS — N644 Mastodynia: Secondary | ICD-10-CM

## 2019-06-10 ENCOUNTER — Other Ambulatory Visit: Payer: Self-pay | Admitting: Family Medicine

## 2019-06-10 ENCOUNTER — Ambulatory Visit
Admission: RE | Admit: 2019-06-10 | Discharge: 2019-06-10 | Disposition: A | Discharge status: Home / Self Care | Payer: BC Managed Care – PPO | Source: Ambulatory Visit | Attending: Family Medicine | Admitting: Family Medicine

## 2019-06-10 ENCOUNTER — Other Ambulatory Visit: Payer: Self-pay

## 2019-06-10 DIAGNOSIS — N6320 Unspecified lump in the left breast, unspecified quadrant: Secondary | ICD-10-CM

## 2019-06-17 ENCOUNTER — Other Ambulatory Visit: Payer: Self-pay | Admitting: Family Medicine

## 2019-07-09 LAB — OB RESULTS CONSOLE HEPATITIS B SURFACE ANTIGEN: Hepatitis B Surface Ag: NEGATIVE

## 2019-07-09 LAB — OB RESULTS CONSOLE GC/CHLAMYDIA
Chlamydia: NEGATIVE
Gonorrhea: NEGATIVE

## 2019-07-09 LAB — OB RESULTS CONSOLE RUBELLA ANTIBODY, IGM: Rubella: IMMUNE

## 2019-07-09 LAB — OB RESULTS CONSOLE RPR: RPR: NONREACTIVE

## 2019-07-09 LAB — OB RESULTS CONSOLE ANTIBODY SCREEN: Antibody Screen: NEGATIVE

## 2019-07-09 LAB — OB RESULTS CONSOLE HIV ANTIBODY (ROUTINE TESTING): HIV: NONREACTIVE

## 2019-07-09 LAB — OB RESULTS CONSOLE ABO/RH: RH Type: POSITIVE

## 2019-10-01 ENCOUNTER — Inpatient Hospital Stay (HOSPITAL_BASED_OUTPATIENT_CLINIC_OR_DEPARTMENT_OTHER): Payer: BC Managed Care – PPO

## 2019-10-01 ENCOUNTER — Inpatient Hospital Stay (HOSPITAL_COMMUNITY)
Admission: EM | Admit: 2019-10-01 | Discharge: 2019-10-01 | Disposition: A | Discharge status: Home / Self Care | Payer: BC Managed Care – PPO | Attending: Obstetrics and Gynecology | Admitting: Obstetrics and Gynecology

## 2019-10-01 ENCOUNTER — Other Ambulatory Visit: Payer: Self-pay

## 2019-10-01 ENCOUNTER — Encounter (HOSPITAL_COMMUNITY): Payer: Self-pay | Admitting: *Deleted

## 2019-10-01 DIAGNOSIS — O4692 Antepartum hemorrhage, unspecified, second trimester: Secondary | ICD-10-CM

## 2019-10-01 DIAGNOSIS — J45909 Unspecified asthma, uncomplicated: Secondary | ICD-10-CM | POA: Diagnosis not present

## 2019-10-01 DIAGNOSIS — Z791 Long term (current) use of non-steroidal anti-inflammatories (NSAID): Secondary | ICD-10-CM | POA: Insufficient documentation

## 2019-10-01 DIAGNOSIS — R1030 Lower abdominal pain, unspecified: Secondary | ICD-10-CM | POA: Insufficient documentation

## 2019-10-01 DIAGNOSIS — O99012 Anemia complicating pregnancy, second trimester: Secondary | ICD-10-CM | POA: Diagnosis not present

## 2019-10-01 DIAGNOSIS — Z3A21 21 weeks gestation of pregnancy: Secondary | ICD-10-CM

## 2019-10-01 DIAGNOSIS — O99019 Anemia complicating pregnancy, unspecified trimester: Secondary | ICD-10-CM

## 2019-10-01 DIAGNOSIS — Z6790 Unspecified blood type, Rh positive: Secondary | ICD-10-CM | POA: Insufficient documentation

## 2019-10-01 DIAGNOSIS — O99513 Diseases of the respiratory system complicating pregnancy, third trimester: Secondary | ICD-10-CM | POA: Diagnosis not present

## 2019-10-01 DIAGNOSIS — O26892 Other specified pregnancy related conditions, second trimester: Secondary | ICD-10-CM | POA: Insufficient documentation

## 2019-10-01 DIAGNOSIS — O26852 Spotting complicating pregnancy, second trimester: Secondary | ICD-10-CM | POA: Diagnosis not present

## 2019-10-01 DIAGNOSIS — O4442 Low lying placenta NOS or without hemorrhage, second trimester: Secondary | ICD-10-CM | POA: Insufficient documentation

## 2019-10-01 DIAGNOSIS — O4690 Antepartum hemorrhage, unspecified, unspecified trimester: Secondary | ICD-10-CM

## 2019-10-01 LAB — URINALYSIS, ROUTINE W REFLEX MICROSCOPIC
Bilirubin Urine: NEGATIVE
Glucose, UA: NEGATIVE mg/dL
Ketones, ur: NEGATIVE mg/dL
Leukocytes,Ua: NEGATIVE
Nitrite: NEGATIVE
Protein, ur: NEGATIVE mg/dL
Specific Gravity, Urine: 1.005 (ref 1.005–1.030)
pH: 7 (ref 5.0–8.0)

## 2019-10-01 LAB — CBC
HCT: 31 % — ABNORMAL LOW (ref 36.0–46.0)
Hemoglobin: 10.5 g/dL — ABNORMAL LOW (ref 12.0–15.0)
MCH: 31.3 pg (ref 26.0–34.0)
MCHC: 33.9 g/dL (ref 30.0–36.0)
MCV: 92.5 fL (ref 80.0–100.0)
Platelets: 239 10*3/uL (ref 150–400)
RBC: 3.35 MIL/uL — ABNORMAL LOW (ref 3.87–5.11)
RDW: 12.3 % (ref 11.5–15.5)
WBC: 12.8 10*3/uL — ABNORMAL HIGH (ref 4.0–10.5)
nRBC: 0 % (ref 0.0–0.2)

## 2019-10-01 LAB — ABO/RH: ABO/RH(D): O POS

## 2019-10-01 NOTE — MAU Provider Note (Signed)
History     CSN: [id number]  Arrival date and time: 10/01/19 3614   First Provider Initiated Contact with Patient 10/01/19 [phone]      Chief Complaint  Patient presents with  . Vaginal Bleeding   33 y.o. G1 @21 .2 wks presenting with VB. Reports light spotting yesterday only when she wiped. She saw it again today. Reports low abd cramping but this has been ongoing during the pregnancy. Last IC was 3 days ago. +FM. Hx of low-lying placenta.    OB History    Gravida  1   Para      Term      Preterm      AB      Living        SAB      TAB      Ectopic      Multiple      Live Births              Past Medical History:  Diagnosis Date  . Asthma     History reviewed. No pertinent surgical history.  Family History  Problem Relation Age of Onset  . Hypertension Mother   . Hypertension Other   . Diabetes Other   . Breast cancer Cousin     Social History   Tobacco Use  . Smoking status: Never Smoker  . Smokeless tobacco: Never Used  Substance Use Topics  . Alcohol use: Not Currently  . Drug use: Never    Allergies: No Known Allergies  Medications Prior to Admission  Medication Sig Dispense Refill Last Dose  . Prenatal Vit-Fe Fumarate-FA (PRENATAL MULTIVITAMIN) TABS tablet Take 1 tablet by mouth daily at 12 noon.   10/01/2019 at 0100  . diclofenac (VOLTAREN) 75 MG EC tablet Take 1 tablet (75 mg total) by mouth 2 (two) times daily. 60 tablet 0     Review of Systems  Gastrointestinal: Positive for abdominal pain.  Genitourinary: Positive for vaginal bleeding.   Physical Exam   Blood pressure (!) 99/59, pulse 88, temperature (!) 97.5 F (36.4 C), resp. rate 18, height 5\' 5"  (1.651 m), weight 76.7 kg, SpO2 100 %.  Physical Exam  Nursing note and vitals reviewed. Constitutional: She is oriented to person, place, and time. She appears well-developed and well-nourished. No distress.  HENT:  Head: Normocephalic and atraumatic.  Neck: Normal range  of motion.  Cardiovascular: Normal rate.  Respiratory: Effort normal. No respiratory distress.  GI: Soft. She exhibits no distension. There is no abdominal tenderness.  gravid  Genitourinary:    Genitourinary Comments: External: no lesions or erythema Vagina: rugated, pink, moist, scant dark pink discharge Cervix closed/long    Musculoskeletal: Normal range of motion.  Neurological: She is alert and oriented to person, place, and time.  Skin: Skin is warm and dry.  Psychiatric: She has a normal mood and affect.  FHT 152  Results for orders placed or performed during the hospital encounter of 10/01/19 (from the past 24 hour(s))  Urinalysis, Routine w reflex microscopic     Status: Abnormal   Collection Time: 10/01/19  7:26 AM  Result Value Ref Range   Color, Urine STRAW (A) YELLOW   APPearance HAZY (A) CLEAR   Specific Gravity, Urine 1.005 1.005 - 1.030   pH 7.0 5.0 - 8.0   Glucose, UA NEGATIVE NEGATIVE mg/dL   Hgb urine dipstick LARGE (A) NEGATIVE   Bilirubin Urine NEGATIVE NEGATIVE   Ketones, ur NEGATIVE NEGATIVE mg/dL   Protein, ur NEGATIVE  NEGATIVE mg/dL   Nitrite NEGATIVE NEGATIVE   Leukocytes,Ua NEGATIVE NEGATIVE   RBC / HPF 0-5 0 - 5 RBC/hpf   WBC, UA 0-5 0 - 5 WBC/hpf   Bacteria, UA RARE (A) NONE SEEN   Squamous Epithelial / LPF 6-10 0 - 5   Mucus PRESENT    Amorphous Crystal PRESENT   CBC     Status: Abnormal   Collection Time: 10/01/19  8:19 AM  Result Value Ref Range   WBC 12.8 (H) 4.0 - 10.5 K/uL   RBC 3.35 (L) 3.87 - 5.11 MIL/uL   Hemoglobin 10.5 (L) 12.0 - 15.0 g/dL   HCT 31.0 (L) 36.0 - 46.0 %   MCV 92.5 80.0 - 100.0 fL   MCH 31.3 26.0 - 34.0 pg   MCHC 33.9 30.0 - 36.0 g/dL   RDW 12.3 11.5 - 15.5 %   Platelets 239 150 - 400 K/uL   nRBC 0.0 0.0 - 0.2 %  ABO/Rh     Status: None   Collection Time: 10/01/19  8:19 AM  Result Value Ref Range   ABO/RH(D) O POS    No rh immune globuloin      NOT A RH IMMUNE GLOBULIN CANDIDATE, PT RH POSITIVE Performed at  [facility] [address], [place], [place] [address]    MAU Course  Procedures  MDM Labs and [place] ordered and reviewed. US shows LLP 1.7cm from int. os. CL 3.6cm. Normal AFV, no abruption seen. Pt reassured. Stable for discharge home.   Assessment and Plan  [redacted] weeks gestation Low-lying placenta Rh positive  Discharge home Follow up at [facility] as scheduled Pelvic rest Return precautions  Allergies as of 10/01/2019   No Known Allergies     Medication List    STOP taking these medications   diclofenac 75 MG EC tablet Commonly known as: VOLTAREN     TAKE these medications   prenatal multivitamin Tabs tablet Take 1 tablet by mouth daily at 12 noon.      Julianne Handler, CNM 10/01/2019, 8:18 AM

## 2019-10-01 NOTE — MAU Note (Addendum)
Yesterday I had light spotting that stopped. Spotting started again this morning when I went to [facility]. Spotting is pink to red. Mild cramping. Does have low lying placenta. Did have intercourse either Monday or Tues.

## 2019-10-01 NOTE — ED Triage Notes (Signed)
Patient reports vaginal spotting onset yesterday with intermittent mild abdominal cramping , she is [redacted] weeks pregnant , G1P0 , consulted MAU nurse , advised ER RN that patient can be transported to MAU , transport tech notified .

## 2019-10-01 NOTE — Discharge Instructions (Signed)
Vaginal Bleeding During Pregnancy, Second Trimester  A small amount of bleeding (spotting) from the vagina is common during pregnancy. Sometimes the bleeding is normal and is not a sign of problems. In some other cases, it is a sign of something serious. Tell your doctor right away if there is any bleeding from your vagina. Follow these instructions at home: Activity  Follow your doctor's instructions about how active you can be.  If needed, make plans for someone to help with your normal activities.  Do not exercise or do activities that take a lot of effort until your doctor says that this is safe.  Do not lift anything that is heavier than 10 lb (4.5 kg) until your doctor says that this is safe.  Do not have sex or orgasms until your doctor says that this is safe. Medicines  Take over-the-counter and prescription medicines only as told by your doctor.  Do not take aspirin. It can cause bleeding. General instructions  Watch your condition for any changes.  Write down: ? The number of pads you use each day. ? How often you change pads. ? How soaked your pads are.  Do not use tampons.  Do not douche.  If you pass any tissue from your vagina, save it to show to your doctor.  Keep all follow-up visits as told by your doctor. This is important. Contact a doctor if:  You have bleeding in the vagina at any time during pregnancy.  You have cramps.  You have a fever that does not get better with medicine. Get help right away if:  You have very bad cramps in your back or belly (abdomen).  You have contractions.  You have chills.  You pass large clots or a lot of tissue from your vagina.  Your bleeding gets worse.  You feel light-headed.  You feel weak.  You pass out (faint).  You are leaking fluid from your vagina.  You have a gush of fluid from your vagina. Summary  Sometimes vaginal bleeding during pregnancy is normal and is not a problem. Sometimes it may  be a sign of something serious.  Tell your doctor about any bleeding from your vagina right away.  Follow your doctor's instructions about how active you can be. You may need someone to help you with your normal activities. This information is not intended to replace advice given to you by your health care provider. Make sure you discuss any questions you have with your health care provider. Document Released: 03/01/2014 Document Revised: 02/03/2019 Document Reviewed: 01/16/2017 Elsevier Patient Education  2020 Elsevier Inc.  

## 2019-10-30 NOTE — L&D Delivery Note (Signed)
Operative Delivery Note Pt reached complete dilation and  +2 station.   We began pushing and the baby had depp and prolonged decelerations to the 60's so the pt and husband were counseled that we needed to proceed with vacuum assisted delivery to expedite.  \  Verbal consent: obtained from family.  Risks and benefits discussed in detail.  Risks include, but are not limited to the risks of anesthesia, bleeding, infection, damage to maternal tissues, fetal cephalhematoma.  There is also the risk of inability to effect vaginal delivery of the head, or shoulder dystocia that cannot be resolved by established maneuvers, leading to the need for emergency cesarean section.   The softcup vacuum was applied to vertex at a +2 station in the green zone.  The vertex was delivered to crowning over several pulls and two popoffs.  The NICU team was present to assess, but when baby was vigorous were able to leave.  At 6:56 PM a healthy female was delivered via Vaginal, Vacuum Investment banker, operational).  Presentation: vertex; Position: Right,, Occiput,, Posterior; Station: +2.   APGAR: 8, 9; weight pending  .   Placenta status: delivered spontaneously .   Cord:  with the following complications:none    Anesthesia: Epidural Instruments: soft cup vacuum Episiotomy: None Lacerations: 1st degree;Labial Suture Repair: 3.0 vicryl rapide Est. Blood Loss (mL): 445  Mom to postpartum.  Baby to Couplet care / Skin to Skin.  Oliver Pila 02/12/2020, 7:27 PM

## 2019-12-15 ENCOUNTER — Other Ambulatory Visit: Payer: Self-pay

## 2019-12-15 ENCOUNTER — Other Ambulatory Visit: Payer: Self-pay | Admitting: Family Medicine

## 2019-12-15 ENCOUNTER — Other Ambulatory Visit: Payer: BC Managed Care – PPO

## 2019-12-15 ENCOUNTER — Ambulatory Visit
Admission: RE | Admit: 2019-12-15 | Discharge: 2019-12-15 | Disposition: A | Discharge status: Home / Self Care | Payer: BC Managed Care – PPO | Source: Ambulatory Visit | Attending: Family Medicine | Admitting: Family Medicine

## 2019-12-15 DIAGNOSIS — N6320 Unspecified lump in the left breast, unspecified quadrant: Secondary | ICD-10-CM

## 2019-12-24 ENCOUNTER — Ambulatory Visit
Admission: RE | Admit: 2019-12-24 | Discharge: 2019-12-24 | Disposition: A | Discharge status: Home / Self Care | Payer: BC Managed Care – PPO | Source: Ambulatory Visit | Attending: Family Medicine | Admitting: Family Medicine

## 2019-12-24 ENCOUNTER — Other Ambulatory Visit: Payer: Self-pay

## 2019-12-24 ENCOUNTER — Other Ambulatory Visit: Payer: Self-pay | Admitting: Family Medicine

## 2019-12-24 DIAGNOSIS — N6320 Unspecified lump in the left breast, unspecified quadrant: Secondary | ICD-10-CM

## 2020-01-14 LAB — OB RESULTS CONSOLE GBS: GBS: NEGATIVE

## 2020-02-01 ENCOUNTER — Telehealth (HOSPITAL_COMMUNITY): Payer: Self-pay | Admitting: *Deleted

## 2020-02-01 ENCOUNTER — Encounter (HOSPITAL_COMMUNITY): Payer: Self-pay | Admitting: *Deleted

## 2020-02-01 NOTE — Telephone Encounter (Signed)
Preadmission screen  

## 2020-02-03 ENCOUNTER — Telehealth (HOSPITAL_COMMUNITY): Payer: Self-pay | Admitting: *Deleted

## 2020-02-03 ENCOUNTER — Encounter (HOSPITAL_COMMUNITY): Payer: Self-pay | Admitting: *Deleted

## 2020-02-03 NOTE — Telephone Encounter (Signed)
Preadmission screen  

## 2020-02-10 ENCOUNTER — Other Ambulatory Visit (HOSPITAL_COMMUNITY)
Admission: RE | Admit: 2020-02-10 | Discharge: 2020-02-10 | Disposition: A | Discharge status: Home / Self Care | Payer: BC Managed Care – PPO | Source: Ambulatory Visit | Attending: Obstetrics and Gynecology | Admitting: Obstetrics and Gynecology

## 2020-02-10 DIAGNOSIS — Z20822 Contact with and (suspected) exposure to covid-19: Secondary | ICD-10-CM | POA: Insufficient documentation

## 2020-02-10 DIAGNOSIS — Z01812 Encounter for preprocedural laboratory examination: Secondary | ICD-10-CM | POA: Insufficient documentation

## 2020-02-10 LAB — SARS CORONAVIRUS 2 (TAT 6-24 HRS): SARS Coronavirus 2: NEGATIVE

## 2020-02-11 ENCOUNTER — Other Ambulatory Visit: Payer: Self-pay | Admitting: Obstetrics and Gynecology

## 2020-02-11 NOTE — H&P (Deleted)
  The note originally documented on this encounter has been moved the the encounter in which it belongs.  

## 2020-02-11 NOTE — H&P (Signed)
Misty Logan is a 34 y.o. female G4P0030 at 8 3/7 weeks (EDD 02/09/20 by LMP c/w 9 week US)presenting for IOL at term.  Prenatal care significant for a low lying placenta which resolve and weel-controlled asthma.  OB History    Gravida  4   Para      Term      Preterm      AB  3   Living        SAB  1   TAB  2   Ectopic      Multiple      Live Births            SAB x 1 EAB x 2  Past Medical History:  Diagnosis Date  . Asthma    Past Surgical History:  Procedure Laterality Date  . NO PAST SURGERIES     Family History: family history includes Breast cancer in her cousin; Diabetes in an other family member; Hypertension in her mother and another family member. Social History:  reports that she has never smoked. She has never used smokeless tobacco. She reports previous alcohol use. She reports that she does not use drugs.     Maternal Diabetes: No Genetic Screening: Normal Maternal Ultrasounds/Referrals: Normal Fetal Ultrasounds or other Referrals:  None Maternal Substance Abuse:  No Significant Maternal Medications:  None Significant Maternal Lab Results:  Group B Strep negative Other Comments:  None  Review of Systems  Constitutional: Negative for fever.  Gastrointestinal: Negative for abdominal pain.   Maternal Medical History:  Contractions: Frequency: irregular.   Perceived severity is mild.    Fetal activity: Perceived fetal activity is normal.    Prenatal complications: asthma  Prenatal Complications - Diabetes: none.      There were no vitals taken for this visit. Maternal Exam:  Uterine Assessment: Contraction strength is mild.  Contraction frequency is irregular.   Abdomen: Patient reports no abdominal tenderness. Fetal presentation: vertex  Introitus: Normal vulva. Normal vagina.    Physical Exam  Constitutional: She appears well-developed.  Cardiovascular: Normal rate and regular rhythm.  Respiratory: Effort normal.  GI:  Soft.  Genitourinary:    Vulva and vagina normal.   Neurological: She is alert.  Psychiatric: She has a normal mood and affect.    Prenatal labs: ABO, Rh: --/--/O POS (12/03 2119) Antibody: Negative (09/10 0000) Rubella: Immune (09/10 0000) RPR: Nonreactive (09/10 0000)  HBsAg: Negative (09/10 0000)  HIV: Non-reactive (09/10 0000)  GBS: Negative/-- (03/18 0000)  One hour GCT 84 Hgb AA First trimester screen and AFP negative  Assessment/Plan: Pt for cytotec ripening and IOL at term.    Oliver Pila 02/11/2020, 5:57 PM

## 2020-02-12 ENCOUNTER — Inpatient Hospital Stay (HOSPITAL_COMMUNITY): Payer: BC Managed Care – PPO | Admitting: Anesthesiology

## 2020-02-12 ENCOUNTER — Inpatient Hospital Stay (HOSPITAL_COMMUNITY): Payer: BC Managed Care – PPO

## 2020-02-12 ENCOUNTER — Inpatient Hospital Stay (HOSPITAL_COMMUNITY)
Admission: AD | Admit: 2020-02-12 | Discharge: 2020-02-14 | DRG: 807 | Disposition: A | Discharge status: Home / Self Care | Payer: BC Managed Care – PPO | Attending: Obstetrics and Gynecology | Admitting: Obstetrics and Gynecology

## 2020-02-12 ENCOUNTER — Encounter (HOSPITAL_COMMUNITY): Payer: Self-pay | Admitting: Obstetrics and Gynecology

## 2020-02-12 ENCOUNTER — Other Ambulatory Visit: Payer: Self-pay

## 2020-02-12 DIAGNOSIS — O9952 Diseases of the respiratory system complicating childbirth: Secondary | ICD-10-CM | POA: Diagnosis present

## 2020-02-12 DIAGNOSIS — O9902 Anemia complicating childbirth: Secondary | ICD-10-CM | POA: Diagnosis present

## 2020-02-12 DIAGNOSIS — J45909 Unspecified asthma, uncomplicated: Secondary | ICD-10-CM | POA: Diagnosis present

## 2020-02-12 DIAGNOSIS — O480 Post-term pregnancy: Secondary | ICD-10-CM | POA: Diagnosis present

## 2020-02-12 DIAGNOSIS — E669 Obesity, unspecified: Secondary | ICD-10-CM | POA: Diagnosis present

## 2020-02-12 DIAGNOSIS — D649 Anemia, unspecified: Secondary | ICD-10-CM | POA: Diagnosis present

## 2020-02-12 DIAGNOSIS — O99214 Obesity complicating childbirth: Secondary | ICD-10-CM | POA: Diagnosis present

## 2020-02-12 DIAGNOSIS — Z20822 Contact with and (suspected) exposure to covid-19: Secondary | ICD-10-CM | POA: Diagnosis present

## 2020-02-12 DIAGNOSIS — O26893 Other specified pregnancy related conditions, third trimester: Secondary | ICD-10-CM | POA: Diagnosis present

## 2020-02-12 DIAGNOSIS — Z3A40 40 weeks gestation of pregnancy: Secondary | ICD-10-CM | POA: Diagnosis not present

## 2020-02-12 LAB — CBC
HCT: 34.2 % — ABNORMAL LOW (ref 36.0–46.0)
Hemoglobin: 11.4 g/dL — ABNORMAL LOW (ref 12.0–15.0)
MCH: 31.1 pg (ref 26.0–34.0)
MCHC: 33.3 g/dL (ref 30.0–36.0)
MCV: 93.4 fL (ref 80.0–100.0)
Platelets: 203 10*3/uL (ref 150–400)
RBC: 3.66 MIL/uL — ABNORMAL LOW (ref 3.87–5.11)
RDW: 13.9 % (ref 11.5–15.5)
WBC: 15.6 10*3/uL — ABNORMAL HIGH (ref 4.0–10.5)
nRBC: 0 % (ref 0.0–0.2)

## 2020-02-12 LAB — RPR: RPR Ser Ql: NONREACTIVE

## 2020-02-12 LAB — TYPE AND SCREEN
ABO/RH(D): O POS
Antibody Screen: NEGATIVE

## 2020-02-12 MED ORDER — OXYCODONE-ACETAMINOPHEN 5-325 MG PO TABS: 1.0000 | ORAL_TABLET | ORAL | Status: DC | PRN

## 2020-02-12 MED ORDER — OXYTOCIN 40 UNITS IN NORMAL SALINE INFUSION - SIMPLE MED: 1.0000 m[IU]/min | INTRAVENOUS | Status: DC

## 2020-02-12 MED ORDER — OXYCODONE-ACETAMINOPHEN 5-325 MG PO TABS: 2.0000 | ORAL_TABLET | ORAL | Status: DC | PRN

## 2020-02-12 MED ORDER — LACTATED RINGERS AMNIOINFUSION: INTRAVENOUS | Status: DC

## 2020-02-12 MED ORDER — TERBUTALINE SULFATE 1 MG/ML IJ SOLN: 0.2500 mg | Freq: Once | INTRAMUSCULAR | Status: DC | PRN

## 2020-02-12 MED ORDER — OXYTOCIN 40 UNITS IN NORMAL SALINE INFUSION - SIMPLE MED
1.0000 m[IU]/min | INTRAVENOUS | Status: DC
  Administered 2020-02-12: 2 m[IU]/min via INTRAVENOUS
  Filled 2020-02-12: qty 1000

## 2020-02-12 MED ORDER — SOD CITRATE-CITRIC ACID 500-334 MG/5ML PO SOLN: 30.0000 mL | ORAL | Status: DC | PRN

## 2020-02-12 MED ORDER — MISOPROSTOL 25 MCG QUARTER TABLET
25.0000 ug | ORAL_TABLET | ORAL | Status: DC
  Administered 2020-02-12: 25 ug via VAGINAL
  Filled 2020-02-12: qty 1

## 2020-02-12 MED ORDER — ONDANSETRON HCL 4 MG/2ML IJ SOLN: 4.0000 mg | Freq: Four times a day (QID) | INTRAMUSCULAR | Status: DC | PRN

## 2020-02-12 MED ORDER — LACTATED RINGERS IV SOLN
500.0000 mL | Freq: Once | INTRAVENOUS | Status: AC
  Administered 2020-02-12: 500 mL via INTRAVENOUS

## 2020-02-12 MED ORDER — SODIUM CHLORIDE (PF) 0.9 % IJ SOLN
INTRAMUSCULAR | Status: DC | PRN
  Administered 2020-02-12: 12 mL/h via EPIDURAL

## 2020-02-12 MED ORDER — LIDOCAINE HCL (PF) 1 % IJ SOLN: 30.0000 mL | INTRAMUSCULAR | Status: DC | PRN

## 2020-02-12 MED ORDER — TETANUS-DIPHTH-ACELL PERTUSSIS 5-2.5-18.5 LF-MCG/0.5 IM SUSP: 0.5000 mL | Freq: Once | INTRAMUSCULAR | Status: DC

## 2020-02-12 MED ORDER — SENNOSIDES-DOCUSATE SODIUM 8.6-50 MG PO TABS
2.0000 | ORAL_TABLET | ORAL | Status: DC
  Administered 2020-02-12: 23:00:00 2 via ORAL
  Filled 2020-02-12 (×2): qty 2

## 2020-02-12 MED ORDER — EPHEDRINE 5 MG/ML INJ: 10.0000 mg | INTRAVENOUS | Status: DC | PRN

## 2020-02-12 MED ORDER — LACTATED RINGERS IV SOLN
500.0000 mL | INTRAVENOUS | Status: DC | PRN
  Administered 2020-02-12: 300 mL via INTRAVENOUS

## 2020-02-12 MED ORDER — ONDANSETRON HCL 4 MG/2ML IJ SOLN: 4.0000 mg | INTRAMUSCULAR | Status: DC | PRN

## 2020-02-12 MED ORDER — DIPHENHYDRAMINE HCL 25 MG PO CAPS: 25.0000 mg | ORAL_CAPSULE | Freq: Four times a day (QID) | ORAL | Status: DC | PRN

## 2020-02-12 MED ORDER — ACETAMINOPHEN 325 MG PO TABS
650.0000 mg | ORAL_TABLET | ORAL | Status: DC | PRN
  Administered 2020-02-13: 650 mg via ORAL
  Filled 2020-02-12: qty 2

## 2020-02-12 MED ORDER — BUTORPHANOL TARTRATE 1 MG/ML IJ SOLN: 1.0000 mg | INTRAMUSCULAR | Status: DC | PRN

## 2020-02-12 MED ORDER — OXYTOCIN BOLUS FROM INFUSION
500.0000 mL | Freq: Once | INTRAVENOUS | Status: AC
  Administered 2020-02-12: 500 mL via INTRAVENOUS

## 2020-02-12 MED ORDER — DIPHENHYDRAMINE HCL 50 MG/ML IJ SOLN: 12.5000 mg | INTRAMUSCULAR | Status: DC | PRN

## 2020-02-12 MED ORDER — IBUPROFEN 600 MG PO TABS
600.0000 mg | ORAL_TABLET | Freq: Four times a day (QID) | ORAL | Status: DC
  Administered 2020-02-12 – 2020-02-14 (×6): 600 mg via ORAL
  Filled 2020-02-12 (×6): qty 1

## 2020-02-12 MED ORDER — ZOLPIDEM TARTRATE 5 MG PO TABS: 5.0000 mg | ORAL_TABLET | Freq: Every evening | ORAL | Status: DC | PRN

## 2020-02-12 MED ORDER — WITCH HAZEL-GLYCERIN EX PADS: 1.0000 "application " | MEDICATED_PAD | CUTANEOUS | Status: DC | PRN

## 2020-02-12 MED ORDER — PRENATAL MULTIVITAMIN CH
1.0000 | ORAL_TABLET | Freq: Every day | ORAL | Status: DC
  Administered 2020-02-13: 1 via ORAL
  Filled 2020-02-12: qty 1

## 2020-02-12 MED ORDER — LIDOCAINE HCL (PF) 1 % IJ SOLN
INTRAMUSCULAR | Status: DC | PRN
  Administered 2020-02-12 (×2): 4 mL via EPIDURAL

## 2020-02-12 MED ORDER — SIMETHICONE 80 MG PO CHEW: 80.0000 mg | CHEWABLE_TABLET | ORAL | Status: DC | PRN

## 2020-02-12 MED ORDER — ONDANSETRON HCL 4 MG PO TABS: 4.0000 mg | ORAL_TABLET | ORAL | Status: DC | PRN

## 2020-02-12 MED ORDER — DIBUCAINE (PERIANAL) 1 % EX OINT: 1.0000 "application " | TOPICAL_OINTMENT | CUTANEOUS | Status: DC | PRN

## 2020-02-12 MED ORDER — PHENYLEPHRINE 40 MCG/ML (10ML) SYRINGE FOR IV PUSH (FOR BLOOD PRESSURE SUPPORT): 80.0000 ug | PREFILLED_SYRINGE | INTRAVENOUS | Status: DC | PRN

## 2020-02-12 MED ORDER — BENZOCAINE-MENTHOL 20-0.5 % EX AERO
1.0000 "application " | INHALATION_SPRAY | CUTANEOUS | Status: DC | PRN
  Administered 2020-02-13: 1 via TOPICAL
  Filled 2020-02-12 (×2): qty 56

## 2020-02-12 MED ORDER — ACETAMINOPHEN 325 MG PO TABS: 650.0000 mg | ORAL_TABLET | ORAL | Status: DC | PRN

## 2020-02-12 MED ORDER — OXYTOCIN 40 UNITS IN NORMAL SALINE INFUSION - SIMPLE MED
2.5000 [IU]/h | INTRAVENOUS | Status: DC
  Administered 2020-02-12: 2.5 [IU]/h via INTRAVENOUS

## 2020-02-12 MED ORDER — COCONUT OIL OIL
1.0000 "application " | TOPICAL_OIL | Status: DC | PRN
  Administered 2020-02-13: 1 via TOPICAL

## 2020-02-12 MED ORDER — FENTANYL-BUPIVACAINE-NACL 0.5-0.125-0.9 MG/250ML-% EP SOLN
12.0000 mL/h | EPIDURAL | Status: DC | PRN
  Filled 2020-02-12: qty 250

## 2020-02-12 MED ORDER — LACTATED RINGERS IV SOLN: INTRAVENOUS | Status: DC

## 2020-02-12 NOTE — Lactation Note (Addendum)
This note was copied from a baby's chart. Lactation Consultation Note  Patient Name: Misty Logan [id number] Date: 02/12/2020 Reason for consult: Initial assessment;1st time breastfeeding;Term P1, 3 hour term female infant. Infant had one stool since birth. Per mom, she brought her medela DEBP from home. Tools given: harmony hand pump, mom can pre-pump breast prior to latching infant due to having flat nipples. Per mom, infant breastfed for 30 minutes at 2115 one hour prior to [facility] entering the room. Per mom, she feels breastfeeding is going well, infant has breastfeed twice for 20 minutes and then 30 minutes. LC did not observe latch at this time. LC discussed hand expression and mom taught back and easily expressed 5 mls of colostrum in bullet, mom will latch infant at next feeding first and then offer EBM on spoon.  Mom was given hand pump to pre-pump breast to help evert nipple shaft out more due to having flat nipples, mom fitted with 27 mm breast flange. Parents will do as much STS as possible. Mom understands to breastfeed infant according to hunger cues, on demand, 8 to 12 times within 24 hours and not  exceed 3 hours without breastfeeding infant.  Mom knows to call RN or LC if she has any questions, concerns or need assistance with latching infant at breast. LC discussed breastfeeding resources after discharge: [facility] hotline, [facility] outpatient clinic and [facility] online breastfeeding support group.  Maternal Data Formula Feeding for Exclusion: No Has patient been taught Hand Expression?: Yes Does the patient have breastfeeding experience prior to this delivery?: No  Feeding Feeding Type: Breast Fed  LATCH Score Latch: Grasps breast easily, tongue down, lips flanged, rhythmical sucking.  Audible Swallowing: A few with stimulation  Type of Nipple: Everted at rest and after stimulation  Comfort (Breast/Nipple): Soft / non-tender  Hold (Positioning): Assistance needed to correctly  position infant at breast and maintain latch.  LATCH Score: 8  Interventions Interventions: Breast feeding basics reviewed;Hand pump;Pre-pump if needed;Hand express;Expressed milk;Skin to skin  Lactation Tools Discussed/Used WIC Program: No Pump Review: Setup, frequency, and cleaning;Milk Storage Initiated by:: Danelle Earthly, IBCLC Date initiated:: 02/12/20   Consult Status Consult Status: Follow-up Date: 02/13/20 Follow-up type: In-patient    Danelle Earthly 02/12/2020, 10:45 PM

## 2020-02-12 NOTE — Anesthesia Preprocedure Evaluation (Signed)
Anesthesia Evaluation    Airway Mallampati: II  TM Distance: >3 FB Neck ROM: Full    Dental no notable dental hx. (+) Teeth Intact   Pulmonary asthma ,    Pulmonary exam normal breath sounds clear to auscultation       Cardiovascular negative cardio ROS Normal cardiovascular exam Rhythm:Regular Rate:Normal     Neuro/Psych negative neurological ROS  negative psych ROS   GI/Hepatic Neg liver ROS, GERD  ,  Endo/Other  Obesity  Renal/GU negative Renal ROS  negative genitourinary   Musculoskeletal negative musculoskeletal ROS (+)   Abdominal (+) + obese,   Peds  Hematology  (+) anemia ,   Anesthesia Other Findings   Reproductive/Obstetrics (+) Pregnancy                             Anesthesia Physical Anesthesia Plan  ASA: II  Anesthesia Plan: Epidural   Post-op Pain Management:    Induction:   PONV Risk Score and Plan:   Airway Management Planned: Natural Airway  Additional Equipment:   Intra-op Plan:   Post-operative Plan:   Informed Consent: I have reviewed the patients History and Physical, chart, labs and discussed the procedure including the risks, benefits and alternatives for the proposed anesthesia with the patient or authorized representative who has indicated his/her understanding and acceptance.       Plan Discussed with: Anesthesiologist  Anesthesia Plan Comments:         Anesthesia Quick Evaluation

## 2020-02-12 NOTE — Progress Notes (Signed)
Patient ID: Misty Logan, female   DOB: 02-May-1986, 34 y.o.   MRN: [id number] Pt received one cytotec and then started pitocin  afeb vss FHR overnight with intermittent moderate variable decels, some prolonged  Currently rare variable decels and moderate variabilty  Cervix 2/70/-1  AROM moderate meconium FSE applied to follow FHR closely Pitocin started and will follow progress

## 2020-02-12 NOTE — Progress Notes (Signed)
Patient ID: Misty Logan, female   DOB: 10-31-1985, 34 y.o.   MRN: [id number] Pt examined and tracing reviewed  afeb VSS  Cervix c/8-9/0  After cervical check FHR had a prolonged deceleration to 60's for 3-4 minutes, then resolved with position changes.  Currently back to 140 baseline with good variability and occasional variable decel.    D/w pt she is making good progress and will watch FHR closely and continue to labor unless becomes persistently concerning.  Hopefully will be able to push soon, but will try to let the vertex get lower before pushing in case FHR decels recur with pushing and requires assisted vaginal delivery.

## 2020-02-12 NOTE — Anesthesia Procedure Notes (Signed)
Epidural Patient location during procedure: OB Start time: 02/12/2020 9:44 AM End time: 02/12/2020 10:00 AM  Staffing Anesthesiologist: Mal Amabile, MD Performed: anesthesiologist   Preanesthetic Checklist Completed: patient identified, IV checked, site marked, risks and benefits discussed, surgical consent, monitors and equipment checked, pre-op evaluation and timeout performed  Epidural Patient position: sitting Prep: DuraPrep and site prepped and draped Patient monitoring: continuous pulse ox and blood pressure Approach: midline Location: L3-L4 Injection technique: LOR saline  Needle:  Needle type: Tuohy  Needle gauge: 17 G Needle length: 9 cm and 9 Needle insertion depth: 5 cm cm Catheter type: closed end flexible Catheter size: 19 Gauge Catheter at skin depth: 10 cm Test dose: negative and Other  Assessment Events: blood not aspirated, injection not painful, no injection resistance, no paresthesia and negative IV test  Additional Notes Patient identified. Risks and benefits discussed including failed block, incomplete  Pain control, post dural puncture headache, nerve damage, paralysis, blood pressure Changes, nausea, vomiting, reactions to medications-both toxic and allergic and post Partum back pain. All questions were answered. Patient expressed understanding and wished to proceed. Sterile technique was used throughout procedure. Epidural site was Dressed with sterile barrier dressing. No paresthesias, signs of intravascular injection Or signs of intrathecal spread were encountered. Difficult due to poor position and positioning. Attempt x 3 Patient was more comfortable after the epidural was dosed. Please see RN's note for documentation of vital signs and FHR which are stable. Reason for block:procedure for pain

## 2020-02-12 NOTE — Progress Notes (Signed)
Patient ID: Misty Logan, female   DOB: 08/21/1986, 34 y.o.   MRN: [id number] Late entry note  Pt received epidural and is comfortable  afeb VSS FHR has +variability and some early and variable decels that improved with amnioinfusion.   Cervix at 1330pm  90/4-5/-1  IUPC in place and will adjust pitocin, follow FHR closely

## 2020-02-13 LAB — CBC
HCT: 30.9 % — ABNORMAL LOW (ref 36.0–46.0)
Hemoglobin: 10.2 g/dL — ABNORMAL LOW (ref 12.0–15.0)
MCH: 31.8 pg (ref 26.0–34.0)
MCHC: 33 g/dL (ref 30.0–36.0)
MCV: 96.3 fL (ref 80.0–100.0)
Platelets: 176 10*3/uL (ref 150–400)
RBC: 3.21 MIL/uL — ABNORMAL LOW (ref 3.87–5.11)
RDW: 14.2 % (ref 11.5–15.5)
WBC: 22.7 10*3/uL — ABNORMAL HIGH (ref 4.0–10.5)
nRBC: 0 % (ref 0.0–0.2)

## 2020-02-13 NOTE — Progress Notes (Signed)
Post Partum Day 1 Subjective: no complaints, up ad lib and tolerating PO  Working on feeds  Objective: Blood pressure (!) 101/58, pulse 80, temperature 98 F (36.7 C), temperature source Oral, resp. rate 17, height 5\' 5"  (1.651 m), weight 90.6 kg, SpO2 100 %.  Physical Exam:  General: alert and cooperative Lochia: appropriate Uterine Fundus: firm   Recent Labs    02/12/20 0030 02/13/20 0444  HGB 11.4* 10.2*  HCT 34.2* 30.9*    Assessment/Plan: Plan for discharge tomorrow   LOS: 1 day   02/15/20 02/13/2020, 9:15 AM

## 2020-02-13 NOTE — Discharge Summary (Signed)
OB Discharge Summary     Patient Name: Misty Logan DOB: 1985/12/04 MRN: [id number]  Date of admission: 02/12/2020 Delivering MD: Paula Compton   Date of discharge: 02/14/2020  Admitting diagnosis: Indication for care in labor and delivery, antepartum [O75.9] Vacuum-assisted vaginal delivery [Z37.9] Intrauterine pregnancy: [redacted]w[redacted]d     Secondary diagnosis:  Active Problems:   Indication for care in labor and delivery, antepartum   Vacuum-assisted vaginal delivery  Additional problems: none     Discharge diagnosis: Term Pregnancy Delivered                                                                                                Post partum procedures:none  Augmentation: AROM, Pitocin and Cytotec  Complications: None  Hospital course:  Induction of Labor With Vaginal Delivery   34 y.o. yo G4P0030 at 110w4d was admitted to the hospital 02/12/2020 for induction of labor.  Indication for induction: Postdates.  Patient had an uncomplicated labor course as follows: Membrane Rupture Time/Date: 7:56 AM ,02/12/2020   Intrapartum Procedures: Episiotomy: None [1]                                         Lacerations:  1st degree [2];Labial [10]  Patient had delivery of a Viable infant.  Information for the patient's newborn:  Drena, Ham Girl Prajna [[id number]]      02/12/2020  Details of delivery can be found in separate delivery note.  Patient had a routine postpartum course. Patient is discharged home 02/14/20.  Physical exam  Vitals:   02/13/20 0640 02/13/20 1452 02/13/20 2044 02/14/20 0607  BP: (!) 101/58 107/72 122/63 109/71  Pulse: 80 87 99 83  Resp: 17 18 16 18   Temp: 98 F (36.7 C) 98 F (36.7 C) 98.6 F (37 C)   TempSrc: Oral Oral Oral   SpO2: 100% 100% 100% 100%  Weight:      Height:       General: alert and cooperative Lochia: appropriate Uterine Fundus: firm  Labs: Lab Results  Component Value Date   WBC 22.7 (H) 02/13/2020   HGB 10.2 (L) 02/13/2020   HCT 30.9 (L) 02/13/2020   MCV 96.3 02/13/2020   PLT 176 02/13/2020   CMP 04/09/2008  Glucose 92  BUN 7  Creatinine 0.8  Sodium 140  Potassium 4.4  Chloride 106    Discharge instruction: per After Visit Summary and "Baby and Me Booklet".  After visit meds:  Allergies as of 02/14/2020   No Known Allergies     Medication List    TAKE these medications   acetaminophen 325 MG tablet Commonly known as: Tylenol Take 2 tablets (650 mg total) by mouth every 4 (four) hours as needed (for pain scale < 4).   albuterol 108 (90 Base) MCG/ACT inhaler Commonly known as: VENTOLIN HFA Inhale 2 puffs into the lungs every 4 (four) hours as needed for wheezing or shortness of breath.   albuterol (2.5 MG/3ML) 0.083% nebulizer solution Commonly known as: PROVENTIL Take  2.5 mg by nebulization every 6 (six) hours as needed for wheezing or shortness of breath.   ibuprofen 600 MG tablet Commonly known as: ADVIL Take 1 tablet (600 mg total) by mouth every 6 (six) hours.   prenatal multivitamin Tabs tablet Take 1 tablet by mouth daily at 12 noon.       Diet: routine diet  Activity: Advance as tolerated. Pelvic rest for 6 weeks.   Outpatient follow up:6 weeks Follow up Appt:No future appointments. Follow up Visit:No follow-ups on file.  Postpartum contraception: Undecided  Newborn Data: Live born female  Birth Weight: 6 lb 14.6 oz (3135 g) APGAR: 8, 9  Newborn Delivery   Birth date/time: 02/12/2020 18:56:00 Delivery type: Vaginal, Vacuum (Extractor)      Baby Feeding: Breast Disposition:home with mother   02/14/2020 Oliver Pila, MD

## 2020-02-13 NOTE — Anesthesia Postprocedure Evaluation (Signed)
Anesthesia Post Note  Patient: Misty Logan  Procedure(s) Performed: AN AD HOC LABOR EPIDURAL     Patient location during evaluation: Mother Baby Anesthesia Type: Epidural Level of consciousness: awake and alert Pain management: pain level controlled Vital Signs Assessment: post-procedure vital signs reviewed and stable Respiratory status: spontaneous breathing, nonlabored ventilation and respiratory function stable Cardiovascular status: stable Postop Assessment: no headache, no backache and epidural receding Anesthetic complications: no    Last Vitals:  Vitals:   02/13/20 0300 02/13/20 0640  BP: 102/67 (!) 101/58  Pulse: 78 80  Resp: 18 17  Temp: 36.5 C 36.7 C  SpO2: 100% 100%    Last Pain:  Vitals:   02/13/20 0640  TempSrc: Oral  PainSc: 1    Pain Goal:                Epidural/Spinal Function Cutaneous sensation: Normal sensation (02/13/20 0640), Patient able to flex knees: Yes (02/13/20 0640), Patient able to lift hips off bed: Yes (02/13/20 0640), Back pain beyond tenderness at insertion site: No (02/13/20 0640), Progressively worsening motor and/or sensory loss: No (02/13/20 0640), Bowel and/or bladder incontinence post epidural: No (02/13/20 0640)  [facility] Records

## 2020-02-14 ENCOUNTER — Encounter (HOSPITAL_COMMUNITY): Payer: Self-pay | Admitting: Obstetrics and Gynecology

## 2020-02-14 MED ORDER — ACETAMINOPHEN 325 MG PO TABS: 650.0000 mg | ORAL_TABLET | ORAL | 0 refills | Status: DC | PRN

## 2020-02-14 MED ORDER — IBUPROFEN 600 MG PO TABS: 600.0000 mg | ORAL_TABLET | Freq: Four times a day (QID) | ORAL | 0 refills | Status: DC

## 2020-02-14 NOTE — Lactation Note (Signed)
This note was copied from a baby's chart. Lactation Consultation Note:  Mother is a P36, infant is 43 hours old.mother is exclusively breastfeeding. She reports that her intent was to exclusively pump but that infant naturally went to the breast. She reports that she has a tiny bit of discomfort on the initial latch.  .  Mother reports that infant is feeding well.  Reviewed hand expression with mother.   Mother has her own  DEBP and a hand pump.  Discussed treatment and prevention of engorgement.  Mother to page to assist with positioning with the next feeding.   Mother to continue to cue base feed infant and feed at least 8-12 times or more in 24 hours and advised to allow for cluster feeding infant as needed.   Mother to continue to due STS. Mother is aware of available LC services at [facility], BFSG'S, OP Dept, and phone # for questions or concerns about breastfeeding.  Mother receptive to all teaching and plan of care.    Patient Name: Misty Logan'V Date: 02/14/2020 Reason for consult: Follow-up assessment   Maternal Data    Feeding Feeding Type: Breast Fed  LATCH Score Latch: Grasps breast easily, tongue down, lips flanged, rhythmical sucking.  Audible Swallowing: A few with stimulation  Type of Nipple: Everted at rest and after stimulation  Comfort (Breast/Nipple): Soft / non-tender  Hold (Positioning): Assistance needed to correctly position infant at breast and maintain latch.  LATCH Score: 8  Interventions Interventions: Hand pump  Lactation Tools Discussed/Used     Consult Status Consult Status: Follow-up    Stevan Born [facility] 02/14/2020, 9:06 AM

## 2020-02-14 NOTE — Progress Notes (Signed)
Post Partum Day 2 Subjective: no complaints, up ad lib and tolerating PO    Objective: Blood pressure 109/71, pulse 83, temperature 98.6 F (37 C), temperature source Oral, resp. rate 18, height 5\' 5"  (1.651 m), weight 90.6 kg, SpO2 100 %, unknown if currently breastfeeding.  Physical Exam:  General: alert and cooperative Lochia: appropriate Uterine Fundus: firm   Recent Labs    02/12/20 0030 02/13/20 0444  HGB 11.4* 10.2*  HCT 34.2* 30.9*    Assessment/Plan: Discharge home   LOS: 2 days   02/15/20 02/14/2020, 10:35 AM

## 2020-02-14 NOTE — Lactation Note (Addendum)
This note was copied from a baby's chart. Lactation Consultation Note:  Mother paged for latch assistance.  Infant placed on the left breast in cross cradle hold. Infant mother has a bircated left nipple. Assist with latch and infant opened wide and got entire nipple in her mouth. She sustained latch for 20 mins. Observed audible swallowing.   Peds came in to assess infant . LC will go back and latch infant to alternate breast in football hold.   Patient Name: Misty Logan NPMVA'E Date: 02/14/2020 Reason for consult: Follow-up assessment   Maternal Data    Feeding Feeding Type: Breast Fed  LATCH Score Latch: Grasps breast easily, tongue down, lips flanged, rhythmical sucking.  Audible Swallowing: Spontaneous and intermittent  Type of Nipple: Everted at rest and after stimulation(lt nipple biforcated)  Comfort (Breast/Nipple): Soft / non-tender  Hold (Positioning): Assistance needed to correctly position infant at breast and maintain latch.  LATCH Score: 9  Interventions Interventions: Assisted with latch;Skin to skin  Lactation Tools Discussed/Used     Consult Status Consult Status: Follow-up    Stevan Born [facility] 02/14/2020, 9:41 AM

## 2020-02-14 NOTE — Progress Notes (Signed)
CSW received and acknowledges consult for EDPS of 9.  Consult screened out due to 9 on EDPS does not warrant a CSW consult.  MOB whom scores are greater than 9/yes to question 10 on [place] Postpartum Depression Screen warrants a CSW consult.   Also, MOB had no concerns with depression/anxiety during prenatal.   Trevonne Nyland D. Ervey Fallin, MSW, LCSWA Clinical Social Worker [phone] 

## 2020-02-15 ENCOUNTER — Encounter (HOSPITAL_COMMUNITY): Payer: Self-pay | Admitting: Obstetrics and Gynecology

## 2020-09-16 NOTE — Progress Notes (Deleted)
   Subjective:    I'm seeing this patient as a consultation for:  Shirlean Mylar, MD. Note will be routed back to referring provider/PCP.  CC: R knee pain  HPI: Pt is a 34 y/o female presenting w/ c/o chronic R knee pain.  She locates her pain to .  Radiating pain: R knee swelling: No R knee mechanical symptoms: Aggravating factors: Treatments tried: OTC pain/anti-inflammatory meds  Past medical history, Surgical history, Family history, Social history, Allergies, and medications have been entered into the medical record, reviewed. ***  Review of Systems: No new headache, visual changes, nausea, vomiting, diarrhea, constipation, dizziness, abdominal pain, skin rash, fevers, chills, night sweats, weight loss, swollen lymph nodes, body aches, joint swelling, muscle aches, chest pain, shortness of breath, mood changes, visual or auditory hallucinations.   Objective:   There were no vitals filed for this visit. General: Well Developed, well nourished, and in no acute distress.  Neuro/Psych: Alert and oriented x3, extra-ocular muscles intact, able to move all 4 extremities, sensation grossly intact. Skin: Warm and dry, no rashes noted.  Respiratory: Not using accessory muscles, speaking in full sentences, trachea midline.  Cardiovascular: Pulses palpable, no extremity edema. Abdomen: Does not appear distended. MSK: ***  Lab and Radiology Results No results found for this or any previous visit (from the past 72 hour(s)). No results found.  Impression and Recommendations:    Assessment and Plan: 34 y.o. female with ***.  PDMP not reviewed this encounter. No orders of the defined types were placed in this encounter.  No orders of the defined types were placed in this encounter.   Discussed warning signs or symptoms. Please see discharge instructions. Patient expresses understanding.   ***

## 2020-09-19 ENCOUNTER — Ambulatory Visit: Payer: BC Managed Care – PPO | Admitting: Family Medicine

## 2020-09-19 NOTE — Progress Notes (Signed)
Subjective:    I'm seeing this patient as a consultation for:  Dr. Hyman Hopes. Note will be routed back to referring provider/PCP.  CC: R knee pain  I, Molly Weber, LAT, ATC, am serving as scribe for Dr. Clementeen Graham.  HPI: Pt is a 34 y/o female presenting w/ chronic R knee pain.  She locates her pain to all over R knee and sometimes specifically to the medial aspect. Pt c/o pn especially w/ bending and going up stairs. Pt notes sometimes knee will give out on her. Pt describes pn as throbbing and sharp. Pt reports that knee have never been the same since she tore her MCL in 2007. Pt had a back in April 2021 and has lost approx 40lbs since giving birth.  She does have some mechanical symptoms including locking and catching.  R knee swelling: no Treatments tried: sometimes wears knee brace  Past medical history, Surgical history, Family history, Social history, Allergies, and medications have been entered into the medical record, reviewed.   Review of Systems: No new headache, visual changes, nausea, vomiting, diarrhea, constipation, dizziness, abdominal pain, skin rash, fevers, chills, night sweats, weight loss, swollen lymph nodes, body aches, joint swelling, muscle aches, chest pain, shortness of breath, mood changes, visual or auditory hallucinations.   Objective:    Vitals:   09/20/20 1004  BP: 118/80  Pulse: 81  SpO2: 99%   General: Well Developed, well nourished, and in no acute distress.  Neuro/Psych: Alert and oriented x3, extra-ocular muscles intact, able to move all 4 extremities, sensation grossly intact. Skin: Warm and dry, no rashes noted.  Respiratory: Not using accessory muscles, speaking in full sentences, trachea midline.  Cardiovascular: Pulses palpable, no extremity edema. Abdomen: Does not appear distended. MSK: Right knee normal-appearing Normal motion with minimal crepitation. Mild tender palpation medial joint line. Stability exam. Positive McMurray's  test. Intact strength.  Lab and Radiology Results  X-ray images right knee obtained today personally and independently interpreted Slightly narrowed medial joint line otherwise normal-appearing no severe degenerative changes.  No fractures visible. Await formal radiology review  Diagnostic Limited MSK Ultrasound of: Right knee Quad tendon intact normal-appearing Small joint effusion superior patellar space. Patellar tendon normal. Lateral joint line normal. Medial joint line hypoechoic change mid substance meniscus concerning for partial tear. Posterior knee no Baker's cyst Impression: Probable medial meniscus tear   Impression and Recommendations:    Assessment and Plan: 34 y.o. female with right knee pain.  History of reported MCL strain 14 years ago.  This may have been an MCL strain but also may have been the medial meniscus tear.  She cannot remember ever having an MRI.  Discussed options today.  Plan for Pennsaid/diclofenac gel home exercise program.  Plan on rechecking in about a month.  If not improved would consider injection versus MRI to further characterize pain.  PDMP not reviewed this encounter. Orders Placed This Encounter  Procedures  . [place] LIMITED JOINT SPACE STRUCTURES LOW RIGHT(NO LINKED CHARGES)    Order Specific Question:   Reason for Exam (SYMPTOM  OR DIAGNOSIS REQUIRED)    Answer:   R knee pain    Order Specific Question:   Preferred imaging location?    Answer:   Adult nurse [facility]  . DG Knee AP/LAT W/Sunrise Right    Standing Status:   Future    Number of Occurrences:   1    Standing Expiration Date:   10/20/2020    Order Specific Question:  Reason for Exam (SYMPTOM  OR DIAGNOSIS REQUIRED)    Answer:   R knee pain    Order Specific Question:   Is patient pregnant?    Answer:   No    Order Specific Question:   Preferred imaging location?    Answer:   [facility]   Meds ordered this encounter  Medications  . Diclofenac  Sodium (PENNSAID) 2 % SOLN    Sig: Place 1 application onto the skin 2 (two) times daily.    Dispense:  112 g    Refill:  2    Home Phone      [phone] Mobile          [phone]     Discussed warning signs or symptoms. Please see discharge instructions. Patient expresses understanding.   The above documentation has been reviewed and is accurate and complete Clementeen Graham, M.D.

## 2020-09-20 ENCOUNTER — Encounter: Payer: Self-pay | Admitting: Family Medicine

## 2020-09-20 ENCOUNTER — Ambulatory Visit (INDEPENDENT_AMBULATORY_CARE_PROVIDER_SITE_OTHER): Payer: BC Managed Care – PPO

## 2020-09-20 ENCOUNTER — Other Ambulatory Visit: Payer: Self-pay

## 2020-09-20 ENCOUNTER — Ambulatory Visit: Payer: BC Managed Care – PPO | Admitting: Family Medicine

## 2020-09-20 ENCOUNTER — Ambulatory Visit: Payer: Self-pay

## 2020-09-20 VITALS — BP 118/80 | HR 81 | Ht 65.0 in | Wt 160.4 lb

## 2020-09-20 DIAGNOSIS — G8929 Other chronic pain: Secondary | ICD-10-CM

## 2020-09-20 DIAGNOSIS — S83221A Peripheral tear of medial meniscus, current injury, right knee, initial encounter: Secondary | ICD-10-CM

## 2020-09-20 DIAGNOSIS — M25561 Pain in right knee: Secondary | ICD-10-CM

## 2020-09-20 MED ORDER — PENNSAID 2 % EX SOLN: 1.0000 "application " | Freq: Two times a day (BID) | CUTANEOUS | 2 refills | Status: DC

## 2020-09-20 NOTE — Progress Notes (Signed)
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  Otherwise looks normal to radiology

## 2020-09-20 NOTE — Patient Instructions (Addendum)
Thank you for coming in today. View at [email or web address] using code: [id number] Use the pensaid or voltaren gel  Pennsaid instructions: You have been given a sample/prescription for Pennsaid, a topical medication.     You are to apply this gel to your injured body part twice daily (morning and evening).   A little goes a long way so you can use about a pea-sized amount for each area.   Spread this small amount over the area into a thin film and let it dry.   Be sure that you do not rub the gel into your skin for more than 10 or 15 seconds otherwise it can irritate you skin.    Once you apply the gel, please do not put any other lotion or clothing in contact with that area for 30 minutes to allow the gel to absorb into your skin.   Some people are sensitive to the medication and can develop a sunburn-like rash.  If you have only mild symptoms it is okay to continue to use the medication but if you have any breakdown of your skin you should discontinue its use and please let us know.   If you have been written a prescription for Pennsaid, you will receive a pump bottle of this topical gel through a mail order pharmacy.  The instructions on the bottle will say to apply two pumps twice a day which may be too much gel for your particular area so use the pea-sized amount as your guide. Instructions for Duexis, Pennsaid and Vimovo:  Your prescription will be filled through a participating [facility] mail order pharmacy.  You will receive a phone call or text from one of the participating pharmacies which can be located in any state in the [place].  You must communicate directly with them to have this medication filled.  When the pharmacy contacts you, they will need your mailing address (for shipment of the medication) andy they will need payment information if you have a copay (typically no more than $10). If you have not heard from them 2-3 days after your appointment with Dr. Denyse Amass,  contact [facility] directly at [phone].   Do the leg exercises.   Please get an Xray today before you leave   Recheck in about 1 month.  If not improved we can decided injection vs MRI.    Meniscus Tear  A meniscus tear is a knee injury that happens when a piece of the meniscus is torn. The meniscus is a thick, rubbery, wedge-shaped cartilage in the knee. Two menisci are located in each knee. They sit between the upper bone (femur) and lower bone (tibia) that make up the knee joint. Each meniscus acts as a shock absorber for the knee. A torn meniscus is one of the most common types of knee injuries. This injury can range from mild to severe. Surgery may be needed to repair a severe tear. What are the causes? This condition may be caused by any kneeling, squatting, twisting, or pivoting movement. Sports-related injuries are the most common cause. These often occur from:  Running and stopping suddenly. ? Changing direction. ? Being tackled or knocked off your feet.  Lifting or carrying heavy weights. As people get older, their menisci get thinner and weaker. In these people, tears can happen more easily, such as from climbing stairs. What increases the risk? You are more likely to develop this condition if you:  Play contact sports.  Have a job that requires kneeling or squatting.  Are female.  Are over 86 years old. What are the signs or symptoms? Symptoms of this condition include:  Knee pain, especially at the side of the knee joint. You may feel pain when the injury occurs, or you may only hear a pop and feel pain later.  A feeling that your knee is clicking, catching, locking, or giving way.  Not being able to fully bend or extend your knee.  Bruising or swelling in your knee. How is this diagnosed? This condition may be diagnosed based on your symptoms and a physical exam. You may also have tests, such as:  X-rays.  MRI.  A procedure to look inside your  knee with a narrow surgical telescope (arthroscopy). You may be referred to a knee specialist (orthopedic surgeon). How is this treated? Treatment for this injury depends on the severity of the tear. Treatment for a mild tear may include:  Rest.  Medicine to reduce pain and swelling. This is usually a nonsteroidal anti-inflammatory drug (NSAID), like ibuprofen.  A knee brace, sleeve, or wrap.  Using crutches or a walker to keep weight off your knee and to help you walk.  Exercises to strengthen your knee (physical therapy). You may need surgery if you have a severe tear or if other treatments are not working. Follow these instructions at home: If you have a brace, sleeve, or wrap:  Wear it as told by your health care provider. Remove it only as told by your health care provider.  Loosen the brace, sleeve, or wrap if your toes tingle, become numb, or turn cold and blue.  Keep the brace, sleeve, or wrap clean and dry.  If the brace, sleeve, or wrap is not waterproof: ? Do not let it get wet. ? Cover it with a watertight covering when you take a bath or shower. Managing pain and swelling   Take over-the-counter and prescription medicines only as told by your health care provider.  If directed, put ice on your knee: ? If you have a removable brace, sleeve, or wrap, remove it as told by your health care provider. ? Put ice in a plastic bag. ? Place a towel between your skin and the bag. ? Leave the ice on for 20 minutes, 2-3 times per day.  Move your toes often to avoid stiffness and to lessen swelling.  Raise (elevate) the injured area above the level of your heart while you are sitting or lying down. Activity  Do not use the injured limb to support your body weight until your health care provider says that you can. Use crutches or a walker as told by your health care provider.  Return to your normal activities as told by your health care provider. Ask your health care  provider what activities are safe for you.  Perform range-of-motion exercises only as told by your health care provider.  Begin doing exercises to strengthen your knee and leg muscles only as told by your health care provider. After you recover, your health care provider may recommend these exercises to help prevent another injury. General instructions  Use a knee brace, sleeve, or wrap as told by your health care provider.  Ask your health care provider when it is safe to drive if you have a brace, sleeve, or wrap on your knee.  Do not use any products that contain nicotine or tobacco, such as cigarettes, e-cigarettes, and chewing tobacco. If you need help quitting, ask your health care provider.  Ask your health care  provider if the medicine prescribed to you: ? Requires you to avoid driving or using heavy machinery. ? Can cause constipation. You may need to take these actions to prevent or treat constipation:  Drink enough fluid to keep your urine pale yellow.  Take over-the-counter or prescription medicines.  Eat foods that are high in fiber, such as beans, whole grains, and fresh fruits and vegetables.  Limit foods that are high in fat and processed sugars, such as fried or sweet foods.  Keep all follow-up visits as told by your health care provider. This is important. Contact a health care provider if:  You have a fever.  Your knee becomes red, tender, or swollen.  Your pain medicine is not helping.  Your symptoms get worse or do not improve after 2 weeks of home care. Summary  A meniscus tear is a knee injury that happens when a piece of the meniscus is torn.  Treatment for this injury depends on the severity of the tear. You may need surgery if you have a severe tear or if other treatments are not working.  Rest, ice, and raise (elevate) your injured knee as told by your health care provider. This will help lessen pain and swelling.  Contact a health care provider  if you have new symptoms, or your symptoms get worse or do not improve after 2 weeks of home care.  Keep all follow-up visits as told by your health care provider. This is important. This information is not intended to replace advice given to you by your health care provider. Make sure you discuss any questions you have with your health care provider. Document Revised: 04/29/2018 Document Reviewed: 04/29/2018 Elsevier Patient Education  2020 [employer].

## 2020-10-25 ENCOUNTER — Ambulatory Visit: Payer: BC Managed Care – PPO | Admitting: Family Medicine

## 2020-11-14 ENCOUNTER — Ambulatory Visit: Payer: BC Managed Care – PPO | Admitting: Family Medicine

## 2021-03-12 ENCOUNTER — Encounter: Payer: Self-pay | Admitting: Emergency Medicine

## 2021-03-12 ENCOUNTER — Other Ambulatory Visit: Payer: Self-pay

## 2021-03-12 ENCOUNTER — Ambulatory Visit (INDEPENDENT_AMBULATORY_CARE_PROVIDER_SITE_OTHER): Payer: BC Managed Care – PPO

## 2021-03-12 ENCOUNTER — Ambulatory Visit
Admission: EM | Admit: 2021-03-12 | Discharge: 2021-03-12 | Disposition: A | Discharge status: Home / Self Care | Payer: BC Managed Care – PPO | Attending: Emergency Medicine | Admitting: Emergency Medicine

## 2021-03-12 DIAGNOSIS — W108XXA Fall (on) (from) other stairs and steps, initial encounter: Secondary | ICD-10-CM

## 2021-03-12 DIAGNOSIS — M79671 Pain in right foot: Secondary | ICD-10-CM

## 2021-03-12 DIAGNOSIS — S93601A Unspecified sprain of right foot, initial encounter: Secondary | ICD-10-CM

## 2021-03-12 MED ORDER — IBUPROFEN 600 MG PO TABS: 600.0000 mg | ORAL_TABLET | Freq: Four times a day (QID) | ORAL | 0 refills | Status: AC | PRN

## 2021-03-12 NOTE — ED Triage Notes (Addendum)
Pt here for right foot pain after tripping down stairs last night; CMS intact; pt is breast feeding

## 2021-03-12 NOTE — Discharge Instructions (Addendum)
No fractures ACE wrap for compression Use anti-inflammatories for pain/swelling. You may take up to 800 mg Ibuprofen every 8 hours with food. You may supplement Ibuprofen with Tylenol [phone] mg every 8 hours.  Ice and elevate  Follow up with sports medicine if not improving

## 2021-03-12 NOTE — ED Provider Notes (Signed)
[facility]    CSN: [id number] Arrival date & time: 03/12/21  9937      History   Chief Complaint Chief Complaint  Patient presents with  . Foot Pain    HPI Misty Logan is a 35 y.o. female history of asthma presenting today for evaluation of right foot injury.  Reports tripped and fell down brick stairs last night.  Since has had pain and discomfort to the top part of her right foot as well as reports decreased range of motion of foot.  Denies any ankle or knee pain.  Sustained small abrasion to right shin, but reports mainly soreness associated with this.  HPI  Past Medical History:  Diagnosis Date  . Asthma     Patient Active Problem List   Diagnosis Date Noted  . Indication for care in labor and delivery, antepartum 02/12/2020  . Vacuum-assisted vaginal delivery 02/12/2020    Past Surgical History:  Procedure Laterality Date  . NO PAST SURGERIES      OB History    Gravida  4   Para  1   Term  1   Preterm      AB  3   Living  1     SAB  1   IAB  2   Ectopic      Multiple      Live Births  1            Home Medications    Prior to Admission medications   Medication Sig Start Date End Date Taking? Authorizing Provider  ibuprofen (ADVIL) 600 MG tablet Take 1 tablet (600 mg total) by mouth every 6 (six) hours as needed. 03/12/21  Yes Balian Schaller C, PA-C  albuterol (PROVENTIL) (2.5 MG/3ML) 0.083% nebulizer solution Take 2.5 mg by nebulization every 6 (six) hours as needed for wheezing or shortness of breath.    [provider]  albuterol (VENTOLIN HFA) 108 (90 Base) MCG/ACT inhaler Inhale 2 puffs into the lungs every 4 (four) hours as needed for wheezing or shortness of breath.    [provider]  Prenatal Vit-Fe Fumarate-FA (PRENATAL MULTIVITAMIN) TABS tablet Take 1 tablet by mouth daily at 12 noon.    [provider]    Family History Family History  Problem Relation Age of Onset  .  Hypertension Mother   . Hypertension Other   . Diabetes Other   . Breast cancer Cousin     Social History Social History   Tobacco Use  . Smoking status: Never Smoker  . Smokeless tobacco: Never Used  Vaping Use  . Vaping Use: Never used  Substance Use Topics  . Alcohol use: Not Currently  . Drug use: Never     Allergies   Patient has no known allergies.   Review of Systems Review of Systems  Constitutional: Negative for fatigue and fever.  Eyes: Negative for visual disturbance.  Respiratory: Negative for shortness of breath.   Cardiovascular: Negative for chest pain.  Gastrointestinal: Negative for abdominal pain, nausea and vomiting.  Musculoskeletal: Positive for arthralgias, gait problem and joint swelling.  Skin: Positive for wound. Negative for color change and rash.  Neurological: Negative for dizziness, weakness, light-headedness and headaches.     Physical Exam Triage Vital Signs ED Triage Vitals  Enc Vitals Group     BP      Pulse      Resp      Temp      Temp src  SpO2      Weight      Height      Head Circumference      Peak Flow      Pain Score      Pain Loc      Pain Edu?      Excl. in GC?    No data found.  Updated Vital Signs BP 123/80 (BP Location: Left Arm)   Pulse 71   Temp 98.1 F (36.7 C) (Oral)   Resp 16   SpO2 97%   Visual Acuity Right Eye Distance:   Left Eye Distance:   Bilateral Distance:    Right Eye Near:   Left Eye Near:    Bilateral Near:     Physical Exam Vitals and nursing note reviewed.  Constitutional:      Appearance: She is well-developed.     Comments: No acute distress  HENT:     Head: Normocephalic and atraumatic.     Nose: Nose normal.  Eyes:     Conjunctiva/sclera: Conjunctivae normal.  Cardiovascular:     Rate and Rhythm: Normal rate.  Pulmonary:     Effort: Pulmonary effort is normal. No respiratory distress.  Abdominal:     General: There is no distension.  Musculoskeletal:         General: Normal range of motion.     Cervical back: Neck supple.     Comments: Right foot: No obvious swelling deformity or discoloration, nontender throughout dorsum of foot, patient points to proximal dorsum of foot as source of pain, dorsalis pedis 2+, nontender medial lateral malleoli  Anterior shin with superficial abrasion with surrounding swelling and mild erythema, appears dry  Skin:    General: Skin is warm and dry.  Neurological:     Mental Status: She is alert and oriented to person, place, and time.      UC Treatments / Results  Labs (all labs ordered are listed, but only abnormal results are displayed) Labs Reviewed - No data to display  EKG   Radiology DG Foot Complete Right  Result Date: 03/12/2021 CLINICAL DATA:  RIGHT foot pain 1 day after tripping down stairs. Pain with weight-bearing. EXAM: RIGHT FOOT COMPLETE - 3+ VIEW COMPARISON:  None FINDINGS: There is no evidence of fracture or dislocation. There is no evidence of arthropathy or other focal bone abnormality. Soft tissues are unremarkable. IMPRESSION: Negative. Electronically Signed   By: Norva Pavlov M.D.   On: 03/12/2021 09:03    Procedures Procedures (including critical care time)  Medications Ordered in UC Medications - No data to display  Initial Impression / Assessment and Plan / UC Course  I have reviewed the triage vital signs and the nursing notes.  Pertinent labs & imaging results that were available during my care of the patient were reviewed by me and considered in my medical decision making (see chart for details).     X-ray negative for acute fractures, treating as sprain/contusion with Ace wrap anti-inflammatories ice elevation and monitor for gradual return to normal use of foot.  Discussed strict return precautions. Patient verbalized understanding and is agreeable with plan.  Final Clinical Impressions(s) / UC Diagnoses   Final diagnoses:  Foot sprain, right, initial  encounter     Discharge Instructions     No fractures ACE wrap for compression Use anti-inflammatories for pain/swelling. You may take up to 800 mg Ibuprofen every 8 hours with food. You may supplement Ibuprofen with Tylenol [phone] mg every 8 hours.  Ice and elevate  Follow up with sports medicine if not improving     ED Prescriptions    Medication Sig Dispense Auth. Provider   ibuprofen (ADVIL) 600 MG tablet Take 1 tablet (600 mg total) by mouth every 6 (six) hours as needed. 30 tablet Natalynn Pedone, [place] C, PA-C     PDMP not reviewed this encounter.   Lew Dawes, [place] 03/12/21 [phone]

## 2021-04-18 IMAGING — US US BREAST*L* LIMITED INC AXILLA
1 series · 5 of 5 positions shown · non-contrast
Comparison: Previous exam(s).

CLINICAL DATA: Short-term follow-up for a probably benign lesion in
the left breast, initially assessed on 12/08/2018.

EXAM:
ULTRASOUND OF THE LEFT BREAST

[Series 1: us breast*left* limited inc axilla · 0.06mm/px · 5 of 5 slices shown]
[im 1/5]
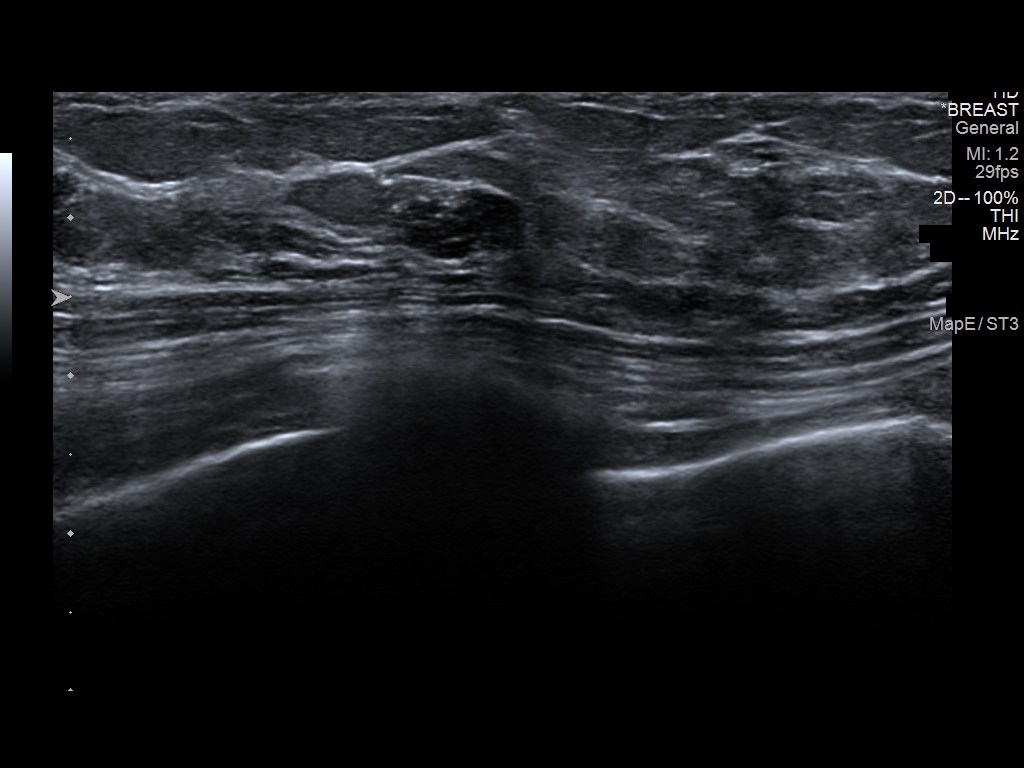
[im 2/5]
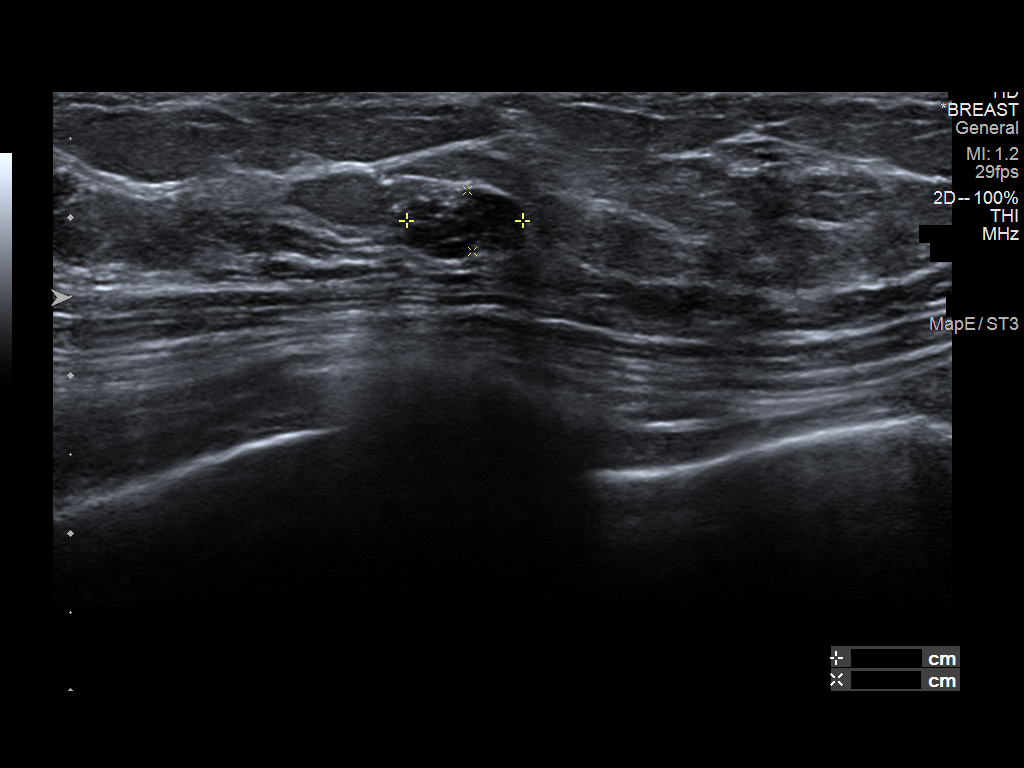
[im 3/5]
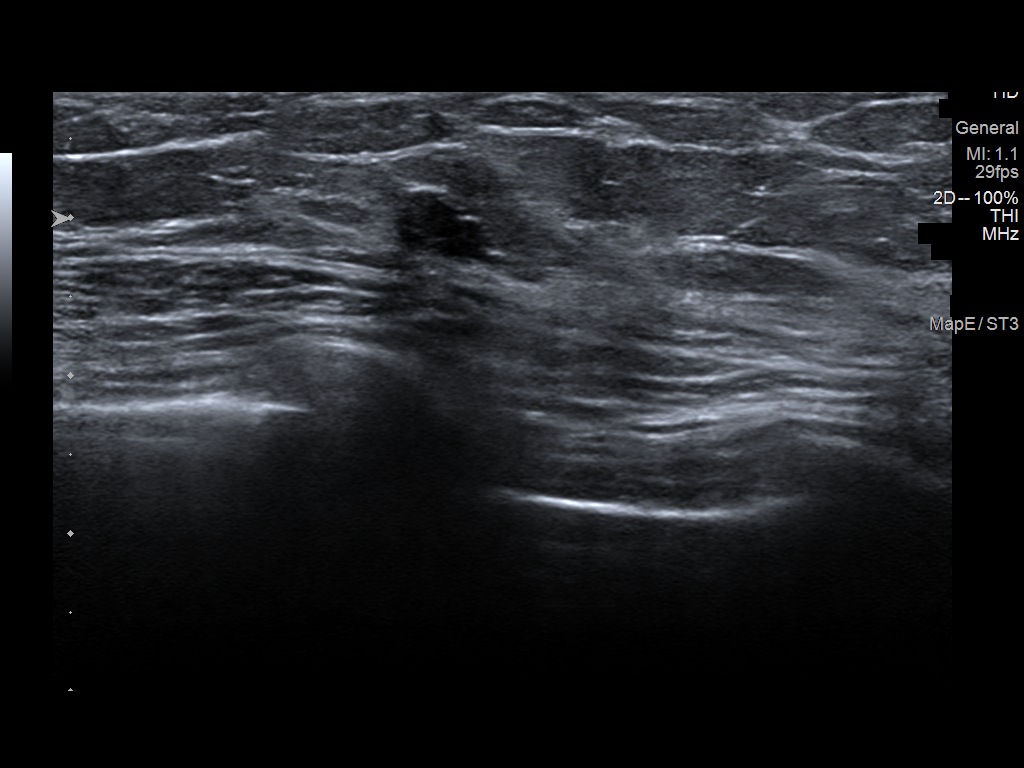
[im 4/5]
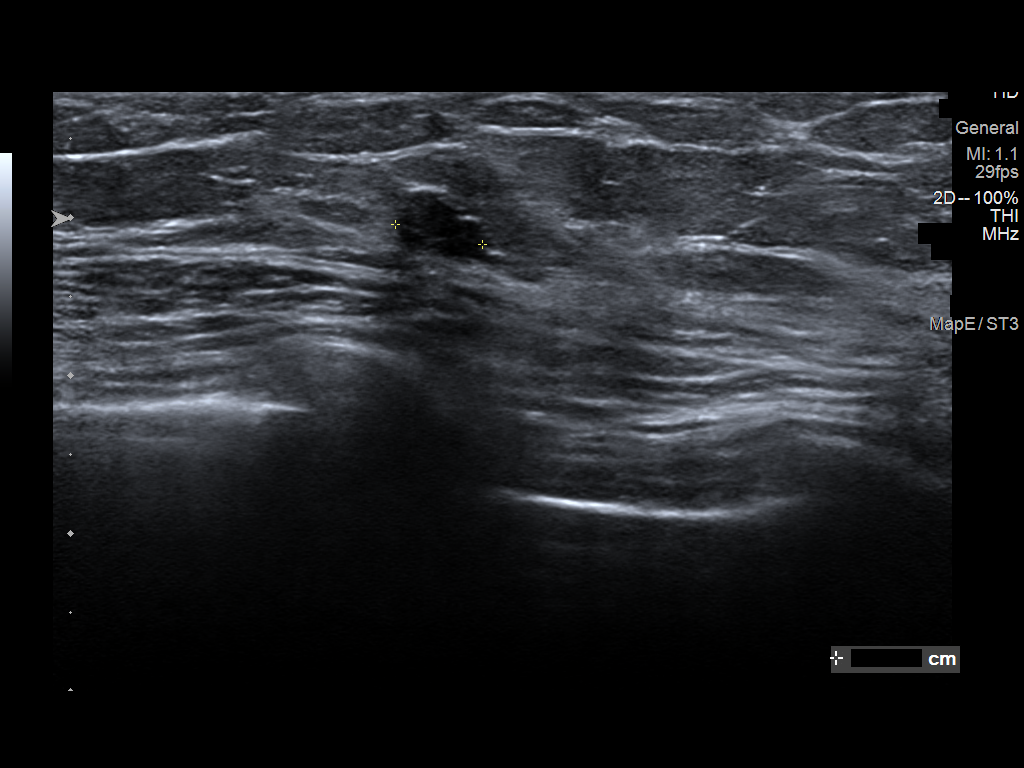
[im 5/5]
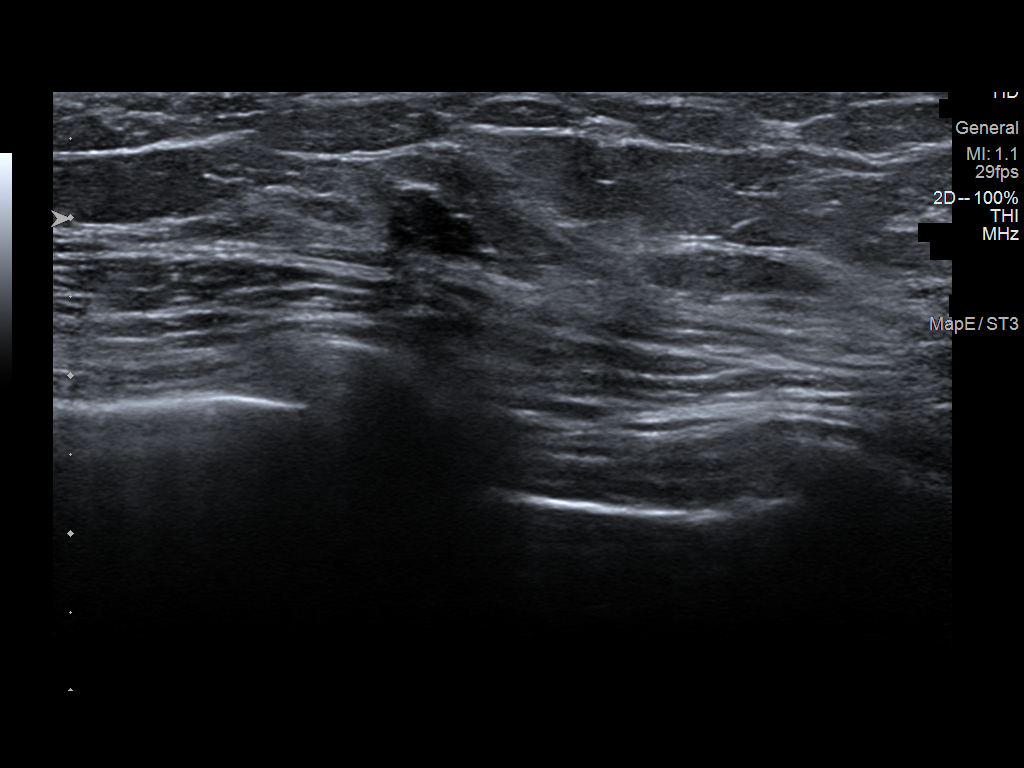

[5 of 5 positions shown; findings below may reference images not displayed]

FINDINGS: Targeted ultrasound is performed, showing an oval circumscribed
hypoechoic, mildly heterogeneous mass in the left breast at 10
o'clock, 5 cm the nipple, measuring 7 x 4 x 6 mm, unchanged from the
prior study.
IMPRESSION: 1. Probably benign left breast mass, likely a complicated cyst,
unchanged since 12/08/2018. Additional short-term follow-up
recommended.

RECOMMENDATION:
Left breast ultrasound in 6 months.

I have discussed the findings and recommendations with the patient.
Results were also provided in writing at the conclusion of the
visit. If applicable, a reminder letter will be sent to the patient
regarding the next appointment.

BI-RADS CATEGORY  3: Probably benign.

## 2021-05-31 ENCOUNTER — Ambulatory Visit
Admission: EM | Admit: 2021-05-31 | Discharge: 2021-05-31 | Disposition: A | Discharge status: Home / Self Care | Payer: BC Managed Care – PPO | Attending: Emergency Medicine | Admitting: Emergency Medicine

## 2021-05-31 ENCOUNTER — Other Ambulatory Visit: Payer: Self-pay

## 2021-05-31 DIAGNOSIS — J0100 Acute maxillary sinusitis, unspecified: Secondary | ICD-10-CM

## 2021-05-31 DIAGNOSIS — U071 COVID-19: Secondary | ICD-10-CM

## 2021-05-31 MED ORDER — ALBUTEROL SULFATE HFA 108 (90 BASE) MCG/ACT IN AERS: 1.0000 | INHALATION_SPRAY | Freq: Four times a day (QID) | RESPIRATORY_TRACT | 0 refills | Status: AC | PRN

## 2021-05-31 MED ORDER — PREDNISONE 20 MG PO TABS: 40.0000 mg | ORAL_TABLET | Freq: Every day | ORAL | 0 refills | Status: AC

## 2021-05-31 MED ORDER — AMOXICILLIN-POT CLAVULANATE 875-125 MG PO TABS: 1.0000 | ORAL_TABLET | Freq: Two times a day (BID) | ORAL | 0 refills | Status: AC

## 2021-05-31 NOTE — Discharge Instructions (Addendum)
Begin Augmentin twice daily x1 week Prednisone 40 mg daily for 5 days-take with food and earlier in the day if possible Albuterol inhaler 1 to 2 puffs every 4-6 hours as needed for shortness of breath, chest tightness or wheezing May continue over-the-counter medicine for cough/congestion as needed for further relief of symptoms Rest and fluids Tylenol and ibuprofen as needed for any headaches Return if not improving or worsening

## 2021-05-31 NOTE — ED Triage Notes (Signed)
Pt presents with positive COVID test on last Thursday.   States she tested herself again and was still positive.   States she has SOB and nasal congestion. Pt expresses concerns for sinus infection.

## 2021-05-31 NOTE — ED Provider Notes (Signed)
[facility]    CSN: [id number] Arrival date & time: 05/31/21  0931      History   Chief Complaint Chief Complaint  Patient presents with   Shortness of Breath   Nasal Congestion    HPI Misty Logan is a 35 y.o. female presenting today for evaluation of COVID infection.  Reports that she has had congestion and shortness of breath.  Reports at home test positive.  Symptoms began approximately 7 to 8 days ago, reports recently repeating home test and was still positive.  She reports that fevers chills body aches have resolved, cough has been improving, but now with worsening sinus congestion and pressure and intermittent discomfort on the left side.  She reports associated chest tightness and shortness of breath.  Has history of asthma.  Denies wheezing.  HPI  Past Medical History:  Diagnosis Date   Asthma     Patient Active Problem List   Diagnosis Date Noted   Indication for care in labor and delivery, antepartum 02/12/2020   Vacuum-assisted vaginal delivery 02/12/2020    Past Surgical History:  Procedure Laterality Date   NO PAST SURGERIES      OB History     Gravida  4   Para  1   Term  1   Preterm      AB  3   Living  1      SAB  1   IAB  2   Ectopic      Multiple      Live Births  1            Home Medications    Prior to Admission medications   Medication Sig Start Date End Date Taking? Authorizing Provider  albuterol (VENTOLIN HFA) 108 (90 Base) MCG/ACT inhaler Inhale 1-2 puffs into the lungs every 6 (six) hours as needed for wheezing or shortness of breath. 05/31/21  Yes Aunica Dauphinee C, PA-C  amoxicillin-clavulanate (AUGMENTIN) 875-125 MG tablet Take 1 tablet by mouth every 12 (twelve) hours for 7 days. 05/31/21 06/07/21 Yes Renn Stille C, PA-C  predniSONE (DELTASONE) 20 MG tablet Take 2 tablets (40 mg total) by mouth daily with breakfast for 5 days. 05/31/21 06/05/21 Yes Saim Almanza C, PA-C  albuterol (PROVENTIL) (2.5  MG/3ML) 0.083% nebulizer solution Take 2.5 mg by nebulization every 6 (six) hours as needed for wheezing or shortness of breath.    [provider]  ibuprofen (ADVIL) 600 MG tablet Take 1 tablet (600 mg total) by mouth every 6 (six) hours as needed. 03/12/21   Emanuell Morina C, PA-C  Prenatal Vit-Fe Fumarate-FA (PRENATAL MULTIVITAMIN) TABS tablet Take 1 tablet by mouth daily at 12 noon.    [provider]    Family History Family History  Problem Relation Age of Onset   Hypertension Mother    Hypertension Other    Diabetes Other    Breast cancer Cousin     Social History Social History   Tobacco Use   Smoking status: Never   Smokeless tobacco: Never  Vaping Use   Vaping Use: Never used  Substance Use Topics   Alcohol use: Not Currently   Drug use: Never     Allergies   Patient has no known allergies.   Review of Systems Review of Systems  Constitutional:  Negative for activity change, appetite change, chills, fatigue and fever.  HENT:  Positive for congestion, rhinorrhea and sinus pressure. Negative for ear pain, sore throat and trouble swallowing.  Eyes:  Negative for discharge and redness.  Respiratory:  Positive for cough, chest tightness and shortness of breath.   Cardiovascular:  Negative for chest pain.  Gastrointestinal:  Negative for abdominal pain, diarrhea, nausea and vomiting.  Musculoskeletal:  Negative for myalgias.  Skin:  Negative for rash.  Neurological:  Negative for dizziness, light-headedness and headaches.    Physical Exam Triage Vital Signs ED Triage Vitals  Enc Vitals Group     BP      Pulse      Resp      Temp      Temp src      SpO2      Weight      Height      Head Circumference      Peak Flow      Pain Score      Pain Loc      Pain Edu?      Excl. in GC?    No data found.  Updated Vital Signs BP 108/74 (BP Location: Right Arm)   Pulse 79   Temp 98.3 F (36.8 C) (Oral)   Resp 20   LMP  (LMP Unknown)    SpO2 98%   Visual Acuity Right Eye Distance:   Left Eye Distance:   Bilateral Distance:    Right Eye Near:   Left Eye Near:    Bilateral Near:     Physical Exam Vitals and nursing note reviewed.  Constitutional:      Appearance: She is well-developed.     Comments: No acute distress  HENT:     Head: Normocephalic and atraumatic.     Ears:     Comments: Bilateral ears without tenderness to palpation of external auricle, tragus and mastoid, EAC's without erythema or swelling, TM's with good bony landmarks and cone of light. Non erythematous.      Nose: Nose normal.     Mouth/Throat:     Comments: Oral mucosa pink and moist, no tonsillar enlargement or exudate. Posterior pharynx patent and nonerythematous, no uvula deviation or swelling. Normal phonation.  Eyes:     Conjunctiva/sclera: Conjunctivae normal.  Cardiovascular:     Rate and Rhythm: Normal rate.  Pulmonary:     Effort: Pulmonary effort is normal. No respiratory distress.     Comments: Breathing comfortably at rest, segmented inspiration in order to obtain full deep breath, CTABL, no wheezing, rales or other adventitious sounds auscultated  Abdominal:     General: There is no distension.  Musculoskeletal:        General: Normal range of motion.     Cervical back: Neck supple.  Skin:    General: Skin is warm and dry.  Neurological:     Mental Status: She is alert and oriented to person, place, and time.     UC Treatments / Results  Labs (all labs ordered are listed, but only abnormal results are displayed) Labs Reviewed - No data to display  EKG   Radiology No results found.  Procedures Procedures (including critical care time)  Medications Ordered in UC Medications - No data to display  Initial Impression / Assessment and Plan / UC Course  I have reviewed the triage vital signs and the nursing notes.  Pertinent labs & imaging results that were available during my care of the patient were  reviewed by me and considered in my medical decision making (see chart for details).     Recent COVID infection, on approximately day 8/9 of  symptoms, worsening sinus pressure and congestion recently, will cover for sinusitis with Augmentin, adding in prednisone and albuterol to help with any underlying asthma symptoms contributing to shortness of breath as well as to further help with any sinus inflammation.  Answered questions regarding COVID.  Discussed strict return precautions. Patient verbalized understanding and is agreeable with plan.  Final Clinical Impressions(s) / UC Diagnoses   Final diagnoses:  COVID-19 virus infection  Acute non-recurrent maxillary sinusitis     Discharge Instructions      Begin Augmentin twice daily x1 week Prednisone 40 mg daily for 5 days-take with food and earlier in the day if possible Albuterol inhaler 1 to 2 puffs every 4-6 hours as needed for shortness of breath, chest tightness or wheezing May continue over-the-counter medicine for cough/congestion as needed for further relief of symptoms Rest and fluids Tylenol and ibuprofen as needed for any headaches Return if not improving or worsening     ED Prescriptions     Medication Sig Dispense Auth. Provider   albuterol (VENTOLIN HFA) 108 (90 Base) MCG/ACT inhaler Inhale 1-2 puffs into the lungs every 6 (six) hours as needed for wheezing or shortness of breath. 1 each Ashla Murph C, PA-C   amoxicillin-clavulanate (AUGMENTIN) 875-125 MG tablet Take 1 tablet by mouth every 12 (twelve) hours for 7 days. 14 tablet Desiray Orchard C, PA-C   predniSONE (DELTASONE) 20 MG tablet Take 2 tablets (40 mg total) by mouth daily with breakfast for 5 days. 10 tablet Roselie Cirigliano, [place] C, PA-C      PDMP not reviewed this encounter.   Lew Dawes, PA-C 05/31/21 1039

## 2022-07-30 IMAGING — DX DG KNEE AP/LAT W/ SUNRISE*R*
3 series · 3 of 3 positions shown · non-contrast
Comparison: None.

CLINICAL DATA: Chronic right knee pain.  No known injury.

EXAM:
RIGHT KNEE 3 VIEWS

[knee ap]
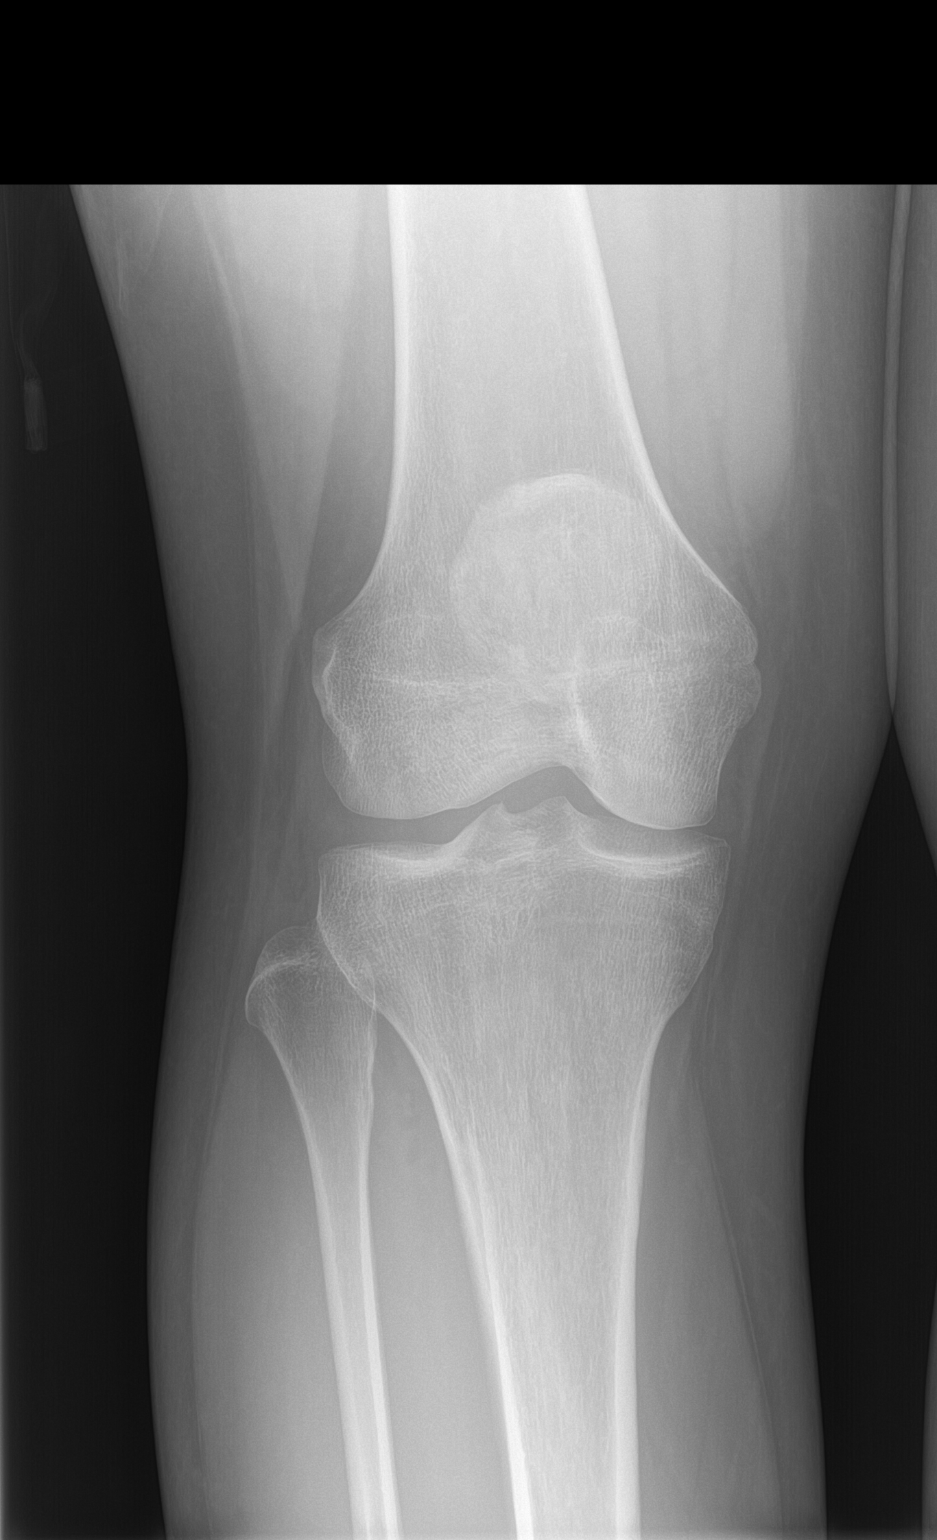

[knee lat]
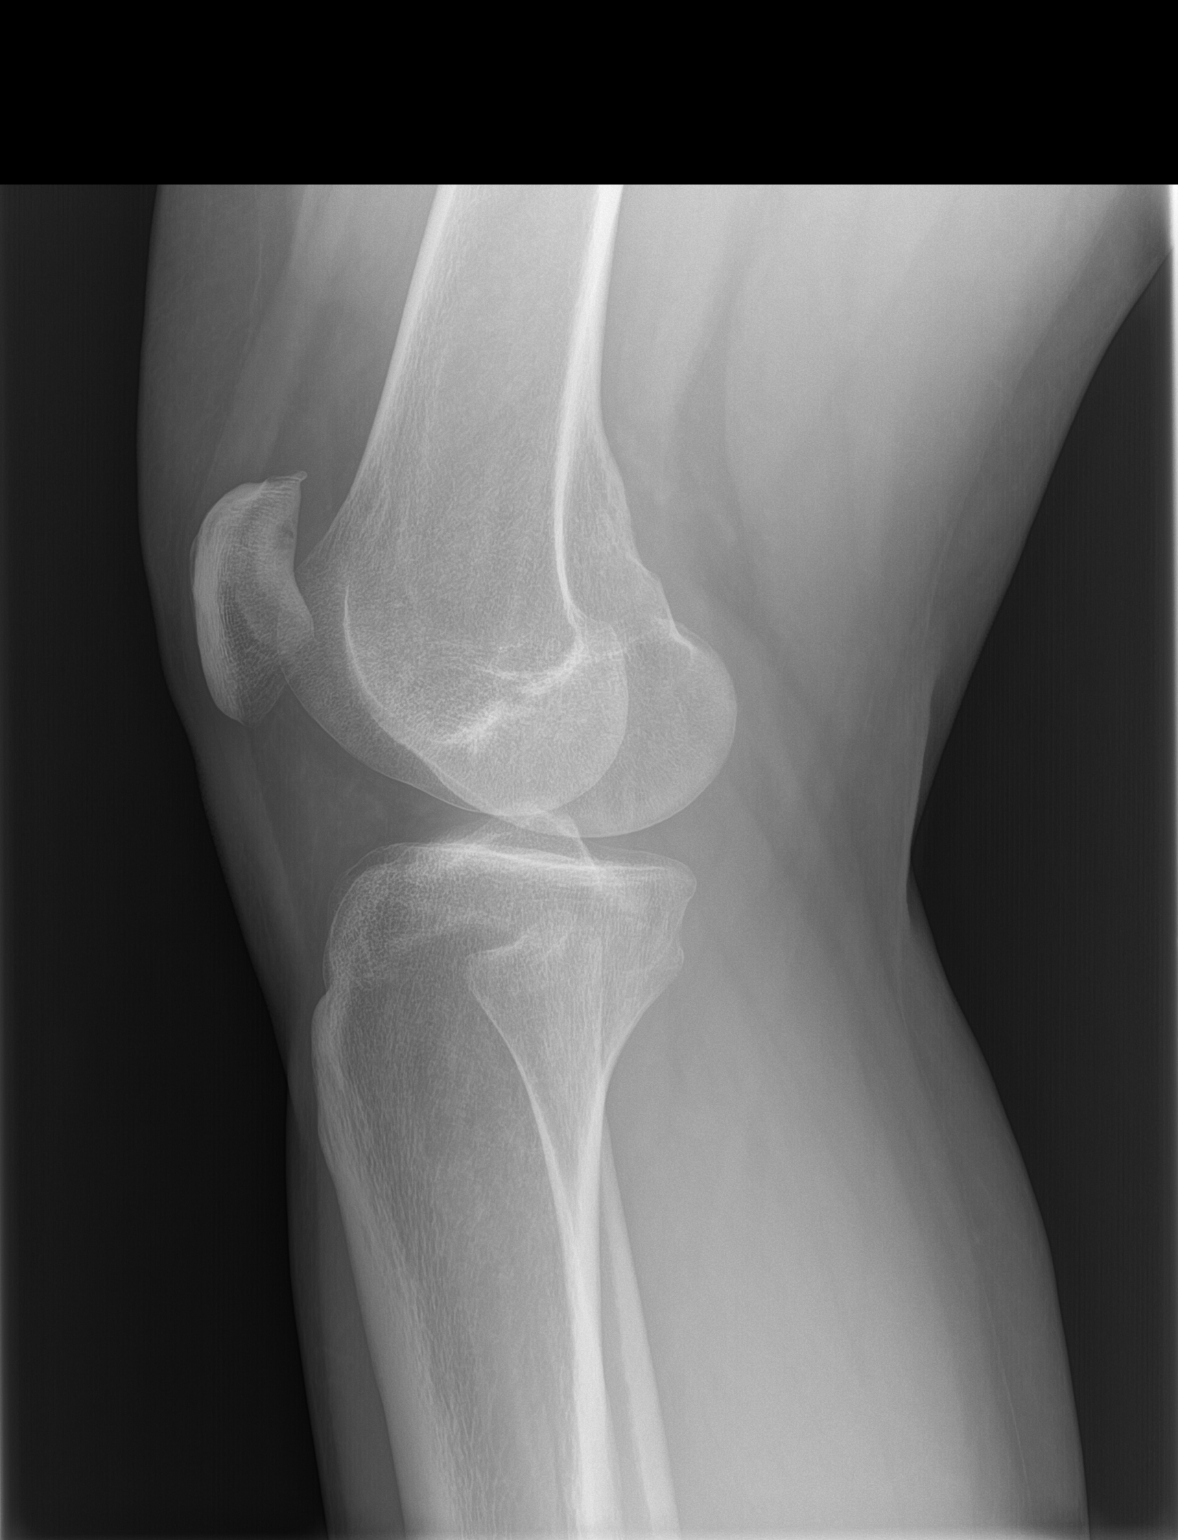

[patella]
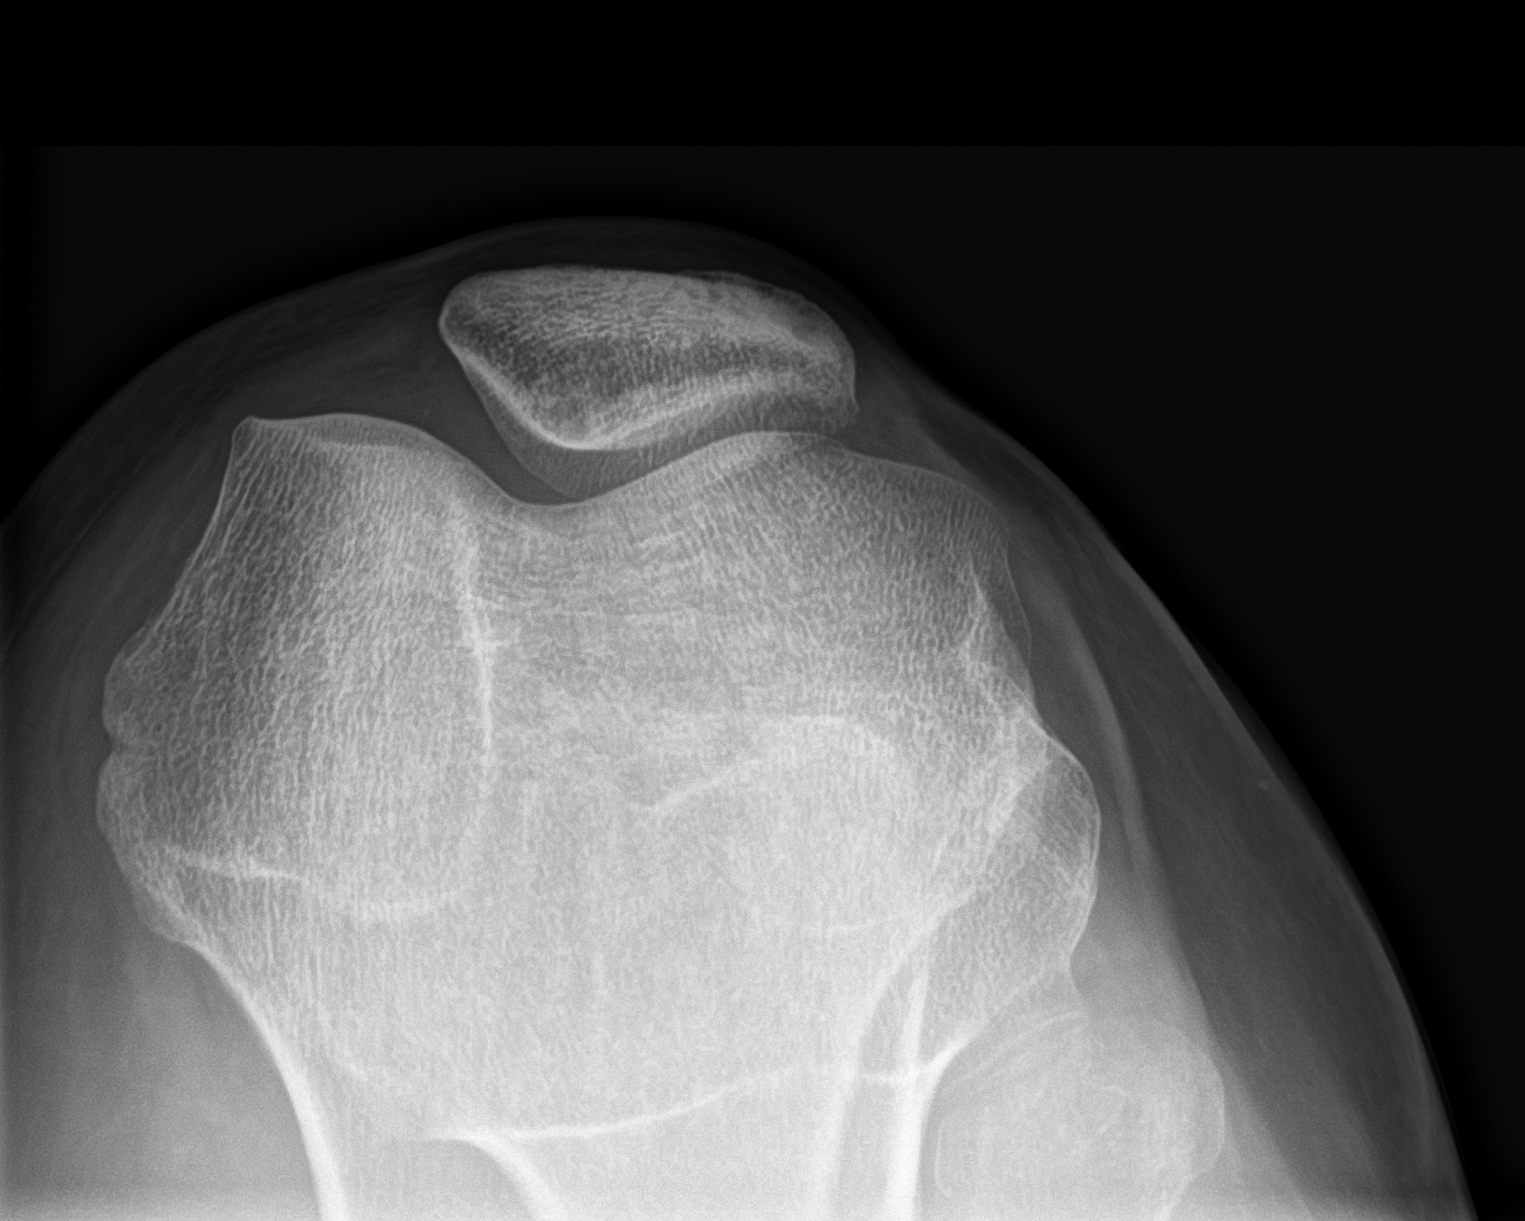

[3 of 3 positions shown; findings below may reference images not displayed]

FINDINGS: There is no acute bony or joint abnormality. A small subchondral
cyst is seen in the superior pole of the patella and there is a tiny
osteophyte off the superior pole. No joint effusion or
chondrocalcinosis. No worrisome bony lesion.
IMPRESSION: Mild patellofemoral osteoarthritis.  Otherwise negative.

## 2022-12-17 DIAGNOSIS — F53 Postpartum depression: Secondary | ICD-10-CM | POA: Insufficient documentation

## 2022-12-17 DIAGNOSIS — J45909 Unspecified asthma, uncomplicated: Secondary | ICD-10-CM | POA: Insufficient documentation

## 2022-12-17 DIAGNOSIS — Z9889 Other specified postprocedural states: Secondary | ICD-10-CM | POA: Insufficient documentation

## 2022-12-17 DIAGNOSIS — G43909 Migraine, unspecified, not intractable, without status migrainosus: Secondary | ICD-10-CM | POA: Insufficient documentation

## 2022-12-17 DIAGNOSIS — N946 Dysmenorrhea, unspecified: Secondary | ICD-10-CM | POA: Insufficient documentation

## 2022-12-18 DIAGNOSIS — Z332 Encounter for elective termination of pregnancy: Secondary | ICD-10-CM | POA: Insufficient documentation

## 2022-12-18 DIAGNOSIS — R4586 Emotional lability: Secondary | ICD-10-CM | POA: Insufficient documentation

## 2024-03-19 ENCOUNTER — Encounter: Payer: Self-pay | Admitting: Emergency Medicine

## 2024-03-19 ENCOUNTER — Ambulatory Visit: Admission: EM | Admit: 2024-03-19 | Discharge: 2024-03-19 | Disposition: A | Discharge status: Home / Self Care

## 2024-03-19 DIAGNOSIS — R21 Rash and other nonspecific skin eruption: Secondary | ICD-10-CM | POA: Diagnosis not present

## 2024-03-19 DIAGNOSIS — D649 Anemia, unspecified: Secondary | ICD-10-CM | POA: Insufficient documentation

## 2024-03-19 MED ORDER — TRIAMCINOLONE ACETONIDE 0.5 % EX OINT: 1.0000 | TOPICAL_OINTMENT | Freq: Two times a day (BID) | CUTANEOUS | 0 refills | Status: AC

## 2024-03-19 MED ORDER — PREDNISONE 20 MG PO TABS: 40.0000 mg | ORAL_TABLET | Freq: Every day | ORAL | 0 refills | Status: AC

## 2024-03-19 NOTE — ED Provider Notes (Signed)
 [facility]    CSN: [id number] Arrival date & time: 03/19/24  1605      History   Chief Complaint Chief Complaint  Patient presents with   Insect Bite    HPI Misty Logan is a 38 y.o. female.   Misty Logan is a 38 y.o. female that presents with a bite-like mark on the top of her hand that appeared yesterday. The patient is unable to identify what caused the mark or provide specific details about its onset. The patient reports that the mark is itchy but does not mention pain or burning sensations. They have not attempted any treatment for the mark prior to this visit. The patient denies experiencing any associated symptoms such as chills, body aches, or fever. They are unable to recall any specific activity or location that might have led to the appearance of the mark. Upon examination, it was noted that the affected hand appears swollen compared to the other hand, suggesting some inflammatory response. The patient expressed a desire for the mark to disappear completely.  The following portions of the patient's history were reviewed and updated as appropriate: allergies, current medications, past family history, past medical history, past social history, past surgical history, and problem list.       Past Medical History:  Diagnosis Date   Asthma     Patient Active Problem List   Diagnosis Date Noted   Anemia 03/19/2024   Mood changes 12/18/2022   Therapeutic abortion in first trimester 12/18/2022   Asthma without status asthmaticus 12/17/2022   Dysmenorrhea 12/17/2022   Migraine without status migrainosus, not intractable 12/17/2022   Postpartum depression 12/17/2022   Status post drug-induced abortion 12/17/2022   Indication for care in labor and delivery, antepartum 02/12/2020   Vacuum-assisted vaginal delivery 02/12/2020   Asthma 10/29/2010    Past Surgical History:  Procedure Laterality Date   NO PAST SURGERIES      OB History     Gravida   4   Para  1   Term  1   Preterm      AB  3   Living  1      SAB  1   IAB  2   Ectopic      Multiple      Live Births  1            Home Medications    Prior to Admission medications   Medication Sig Start Date End Date Taking? Authorizing Provider  misoprostol  (CYTOTEC ) 200 MCG tablet 24-48 hours after mifepristone, place 2 tablets in each cheek and wait 30 min until they dissolve. Repeat in 4 hours. 12/10/22  Yes [provider]  predniSONE  (DELTASONE ) 20 MG tablet Take 2 tablets (40 mg total) by mouth daily for 5 days. 03/19/24 03/24/24 Yes Maryruth Sol, FNP  tiZANidine (ZANAFLEX) 4 MG tablet Take 4 mg by mouth. 05/15/22  Yes [provider]  triamcinolone ointment (KENALOG) 0.5 % Apply 1 Application topically 2 (two) times daily. Apply thin layer to affected areas twice a day 03/19/24  Yes Kassandra Meriweather, [place], FNP  albuterol  (PROVENTIL ) (2.5 MG/3ML) 0.083% nebulizer solution Take 2.5 mg by nebulization every 6 (six) hours as needed for wheezing or shortness of breath.    [provider]  albuterol  (VENTOLIN  HFA) 108 (90 Base) MCG/ACT inhaler Inhale 1-2 puffs into the lungs every 6 (six) hours as needed for wheezing or shortness of breath. 05/31/21   Wieters, Hallie C, PA-C  ibuprofen  (ADVIL ) 600 MG tablet Take 1 tablet (600 mg total) by mouth every 6 (six) hours as needed. 03/12/21   Wieters, Hallie C, PA-C  Prenatal Vit-Fe Fumarate-FA (PRENATAL MULTIVITAMIN) TABS tablet Take 1 tablet by mouth daily at 12 noon.    [provider]    Family History Family History  Problem Relation Age of Onset   Hypertension Mother    Hypertension Other    Diabetes Other    Breast cancer Cousin     Social History Social History   Tobacco Use   Smoking status: Never    Passive exposure: Never   Smokeless tobacco: Never  Vaping Use   Vaping status: Never Used  Substance Use Topics   Alcohol use: Not Currently   Drug use: Never      Allergies   Patient has no known allergies.   Review of Systems Review of Systems  Skin:  Positive for wound.  All other systems reviewed and are negative.    Physical Exam Triage Vital Signs ED Triage Vitals  Encounter Vitals Group     BP 03/19/24 1643 134/85     Systolic BP Percentile --      Diastolic BP Percentile --      Pulse Rate 03/19/24 1643 90     Resp 03/19/24 1643 18     Temp 03/19/24 1643 97.9 F (36.6 C)     Temp Source 03/19/24 1643 Oral     SpO2 03/19/24 1643 97 %     Weight 03/19/24 1642 160 lb 7.9 oz (72.8 kg)     Height --      Head Circumference --      Peak Flow --      Pain Score 03/19/24 1640 0     Pain Loc --      Pain Education --      Exclude from Growth Chart --    No data found.  Updated Vital Signs BP 134/85 (BP Location: Left Arm)   Pulse 90   Temp 97.9 F (36.6 C) (Oral)   Resp 18   Wt 160 lb 7.9 oz (72.8 kg)   LMP 03/19/2024 (Exact Date)   SpO2 97%   Breastfeeding No   BMI 26.71 kg/m   Visual Acuity Right Eye Distance:   Left Eye Distance:   Bilateral Distance:    Right Eye Near:   Left Eye Near:    Bilateral Near:     Physical Exam Vitals reviewed.  Constitutional:      General: She is not in acute distress.    Appearance: Normal appearance. She is not toxic-appearing.  HENT:     Head: Normocephalic.     Mouth/Throat:     Mouth: Mucous membranes are moist.  Eyes:     Conjunctiva/sclera: Conjunctivae normal.  Cardiovascular:     Rate and Rhythm: Normal rate and regular rhythm.     Heart sounds: Normal heart sounds.  Pulmonary:     Effort: Pulmonary effort is normal.     Breath sounds: Normal breath sounds.  Musculoskeletal:        General: Normal range of motion.  Skin:    General: Skin is warm and dry.     Findings: Rash present. Rash is pustular.     Comments: Linear pustular lesion noted to the dorsal aspect of the right hand without underlying fluctuance or erythema. No increased warmth or  drainage.   Neurological:     General: No focal deficit present.  Mental Status: She is alert and oriented to person, place, and time.      UC Treatments / Results  Labs (all labs ordered are listed, but only abnormal results are displayed) Labs Reviewed - No data to display  EKG   Radiology No results found.  Procedures Procedures (including critical care time)  Medications Ordered in UC Medications - No data to display  Initial Impression / Assessment and Plan / UC Course  I have reviewed the triage vital signs and the nursing notes.  Pertinent labs & imaging results that were available during my care of the patient were reviewed by me and considered in my medical decision making (see chart for details).     38 year old female presents with a bite-like lesion on the dorsal aspect of the hand, first noticed yesterday. The lesion is pruritic but not associated with significant pain or burning. On exam, there is no notable erythema, warmth, or signs of deeper tissue involvement, making cellulitis or a deep infection unlikely. Mild asymmetry in hand swelling suggests a localized inflammatory response. Absence of systemic symptoms such as fever, chills, or malaise supports a diagnosis of a localized reaction, most likely from an insect bite or minor trauma. Differential includes early cellulitis, contact dermatitis, mild allergic reaction, or herpes zoster, though no vesicular changes are noted at this time. Conservative treatment initiated with prednisone  and triamcinolone ointment due to the benign appearance and lack of alarming features. OTC Benadryl  may be used for symptomatic relief of itching. A photograph of the lesion was uploaded to the chart for documentation and comparison. Patient advised to monitor closely and return if symptoms worsen or new concerns arise.  Today's evaluation has revealed no signs of a dangerous process. Discussed diagnosis with patient and/or  guardian. Patient and/or guardian aware of their diagnosis, possible red flag symptoms to watch out for and need for close follow up. Patient and/or guardian understands verbal and written discharge instructions. Patient and/or guardian comfortable with plan and disposition.  Patient and/or guardian has a clear mental status at this time, good insight into illness (after discussion and teaching) and has clear judgment to make decisions regarding their care  Documentation was completed with the aid of voice recognition software. Transcription may contain typographical errors. Final Clinical Impressions(s) / UC Diagnoses   Final diagnoses:  Rash and nonspecific skin eruption     Discharge Instructions      You were seen today for a rash on your hand. Based on your history and the exam, the rash does not appear to be a classic presentation of shingles. It is more likely an inflammatory skin reaction or possibly the result of an insect bite. At this time, there are no signs of a serious infection or other concerning findings.  To help reduce inflammation and relieve symptoms, you have been prescribed a short course of oral steroids and a steroid ointment. Apply the ointment to the affected area twice a day. You may take Benadryl  as needed for itching. Do not scratch or manipulate the area.   Please monitor the area closely. If the rash worsens, spreads, becomes increasingly painful, develops blisters or open sores, or if you develop a fever, chills, or any new symptoms, follow up with your healthcare provider. If the symptoms do not improve within a few days of starting treatment, or if you are concerned at any point, please seek medical attention.    ED Prescriptions     Medication Sig Dispense Auth. Provider  predniSONE  (DELTASONE ) 20 MG tablet Take 2 tablets (40 mg total) by mouth daily for 5 days. 10 tablet Leeann Bady, FNP   triamcinolone ointment (KENALOG) 0.5 % Apply 1 Application  topically 2 (two) times daily. Apply thin layer to affected areas twice a day 30 g Maryruth Sol, FNP      PDMP not reviewed this encounter.   Maryruth Sol, [place] 03/19/24 2017

## 2024-03-19 NOTE — ED Triage Notes (Addendum)
 Pt presents with what she believes is a bug bite on back of right hand. Pt states it is not painful but the area is itchy when irrigated. Pt also says the hand feels a little numb.

## 2024-03-19 NOTE — Discharge Instructions (Addendum)
 You were seen today for a rash on your hand. Based on your history and the exam, the rash does not appear to be a classic presentation of shingles. It is more likely an inflammatory skin reaction or possibly the result of an insect bite. At this time, there are no signs of a serious infection or other concerning findings.  To help reduce inflammation and relieve symptoms, you have been prescribed a short course of oral steroids and a steroid ointment. Apply the ointment to the affected area twice a day. You may take Benadryl  as needed for itching. Do not scratch or manipulate the area.   Please monitor the area closely. If the rash worsens, spreads, becomes increasingly painful, develops blisters or open sores, or if you develop a fever, chills, or any new symptoms, follow up with your healthcare provider. If the symptoms do not improve within a few days of starting treatment, or if you are concerned at any point, please seek medical attention.
